# Patient Record
Sex: Female | Born: 1975 | Race: Black or African American | Hispanic: No | Marital: Married | Smoking: Never smoker
Health system: Southern US, Community
[De-identification: ages and names within clinical notes are randomized; demographics above are authoritative.]

## PROBLEM LIST (undated history)

## (undated) DIAGNOSIS — E785 Hyperlipidemia, unspecified: Secondary | ICD-10-CM

## (undated) DIAGNOSIS — K219 Gastro-esophageal reflux disease without esophagitis: Secondary | ICD-10-CM

## (undated) DIAGNOSIS — R7303 Prediabetes: Secondary | ICD-10-CM

## (undated) DIAGNOSIS — R0602 Shortness of breath: Secondary | ICD-10-CM

## (undated) DIAGNOSIS — G4730 Sleep apnea, unspecified: Secondary | ICD-10-CM

## (undated) DIAGNOSIS — D649 Anemia, unspecified: Secondary | ICD-10-CM

## (undated) DIAGNOSIS — F419 Anxiety disorder, unspecified: Secondary | ICD-10-CM

## (undated) DIAGNOSIS — R600 Localized edema: Secondary | ICD-10-CM

## (undated) HISTORY — DX: Localized edema: R60.0

## (undated) HISTORY — DX: Prediabetes: R73.03

## (undated) HISTORY — DX: Hyperlipidemia, unspecified: E78.5

## (undated) HISTORY — DX: Shortness of breath: R06.02

## (undated) HISTORY — DX: Sleep apnea, unspecified: G47.30

## (undated) HISTORY — DX: Anemia, unspecified: D64.9

## (undated) HISTORY — DX: Anxiety disorder, unspecified: F41.9

## (undated) HISTORY — DX: Gastro-esophageal reflux disease without esophagitis: K21.9

---

## 2017-09-12 ENCOUNTER — Encounter: Payer: Self-pay | Admitting: Family Medicine

## 2017-11-04 ENCOUNTER — Telehealth: Payer: Self-pay | Admitting: Family Medicine

## 2017-11-04 NOTE — Telephone Encounter (Signed)
Records received from [facility] sent back in folder

## 2017-11-17 ENCOUNTER — Encounter: Payer: Self-pay | Admitting: Family Medicine

## 2017-11-17 ENCOUNTER — Ambulatory Visit (INDEPENDENT_AMBULATORY_CARE_PROVIDER_SITE_OTHER): Payer: 59 | Admitting: Family Medicine

## 2017-11-17 ENCOUNTER — Other Ambulatory Visit (HOSPITAL_COMMUNITY)
Admission: RE | Admit: 2017-11-17 | Discharge: 2017-11-17 | Disposition: A | Discharge status: Home / Self Care | Payer: 59 | Source: Ambulatory Visit | Attending: Family Medicine | Admitting: Family Medicine

## 2017-11-17 VITALS — BP 120/74 | HR 80 | Ht 68.5 in | Wt 311.0 lb

## 2017-11-17 DIAGNOSIS — Z1231 Encounter for screening mammogram for malignant neoplasm of breast: Secondary | ICD-10-CM

## 2017-11-17 DIAGNOSIS — R7303 Prediabetes: Secondary | ICD-10-CM

## 2017-11-17 DIAGNOSIS — Z124 Encounter for screening for malignant neoplasm of cervix: Secondary | ICD-10-CM

## 2017-11-17 DIAGNOSIS — Z0000 Encounter for general adult medical examination without abnormal findings: Secondary | ICD-10-CM | POA: Insufficient documentation

## 2017-11-17 DIAGNOSIS — Z1239 Encounter for other screening for malignant neoplasm of breast: Secondary | ICD-10-CM

## 2017-11-17 LAB — POCT URINALYSIS DIP (PROADVANTAGE DEVICE)
BILIRUBIN UA: NEGATIVE
BILIRUBIN UA: NEGATIVE mg/dL
Glucose, UA: NEGATIVE mg/dL
LEUKOCYTES UA: NEGATIVE
NITRITE UA: NEGATIVE
Protein Ur, POC: NEGATIVE mg/dL
RBC UA: NEGATIVE
Specific Gravity, Urine: 1.025
Urobilinogen, Ur: NEGATIVE
pH, UA: 6 (ref 5.0–8.0)

## 2017-11-17 NOTE — Patient Instructions (Signed)
Start using the free app My Fitness Pal to track your daily calories and carbohydrates.  Cutting back even 50% on carbohydrates and sugars will make a difference.  Buy healthy snacks and clean out your kitchen of unhealthy ones.   Call and schedule your mammogram.   We will call you with your lab results.     Preventative Care for Adults - Female      MAINTAIN REGULAR HEALTH EXAMS:  A routine yearly physical is a good way to check in with your primary care provider about your health and preventive screening. It is also an opportunity to share updates about your health and any concerns you have, and receive a thorough all-over exam.   Most health insurance companies pay for at least some preventative services.  Check with your health plan for specific coverages.  WHAT PREVENTATIVE SERVICES DO WOMEN NEED?  Adult women should have their weight and blood pressure checked regularly.   Women age 40 and older should have their cholesterol levels checked regularly.  Women should be screened for cervical cancer with a Pap smear and pelvic exam beginning at either age 40, or 3 years after they become sexually activity.    Breast cancer screening generally begins at age 40 with a mammogram and breast exam by your primary care provider.    Beginning at age 40 and continuing to age 40, women should be screened for colorectal cancer.  Certain people may need continued testing until age 40.  Updating vaccinations is part of preventative care.  Vaccinations help protect against diseases such as the flu.  Osteoporosis is a disease in which the bones lose minerals and strength as we age. Women ages 8365 and over should discuss this with their caregivers, as should women after menopause who have other risk factors.  Lab tests are generally done as part of preventative care to screen for anemia and blood disorders, to screen for problems with the kidneys and liver, to screen for bladder problems, to  check blood sugar, and to check your cholesterol level.  Preventative services generally include counseling about diet, exercise, avoiding tobacco, drugs, excessive alcohol consumption, and sexually transmitted infections.    GENERAL RECOMMENDATIONS FOR GOOD HEALTH:  Healthy diet:  Eat a variety of foods, including fruit, vegetables, animal or vegetable protein, such as meat, fish, chicken, and eggs, or beans, lentils, tofu, and grains, such as rice.  Drink plenty of water daily.  Decrease saturated fat in the diet, avoid lots of red meat, processed foods, sweets, fast foods, and fried foods.  Exercise:  Aerobic exercise helps maintain good heart health. At least 30-40 minutes of moderate-intensity exercise is recommended. For example, a brisk walk that increases your heart rate and breathing. This should be done on most days of the week.   Find a type of exercise or a variety of exercises that you enjoy so that it becomes a part of your daily life.  Examples are running, walking, swimming, water aerobics, and biking.  For motivation and support, explore group exercise such as aerobic class, spin class, Zumba, Yoga,or  martial arts, etc.    Set exercise goals for yourself, such as a certain weight goal, walk or run in a race such as a 5k walk/run.  Speak to your primary care provider about exercise goals.  Disease prevention:  If you smoke or chew tobacco, find out from your caregiver how to quit. It can literally save your life, no matter how long you have been  a tobacco user. If you do not use tobacco, never begin.   Maintain a healthy diet and normal weight. Increased weight leads to problems with blood pressure and diabetes.   The Body Mass Index or BMI is a way of measuring how much of your body is fat. Having a BMI above 27 increases the risk of heart disease, diabetes, hypertension, stroke and other problems related to obesity. Your caregiver can help determine your BMI and based  on it develop an exercise and dietary program to help you achieve or maintain this important measurement at a healthful level.  High blood pressure causes heart and blood vessel problems.  Persistent high blood pressure should be treated with medicine if weight loss and exercise do not work.   Fat and cholesterol leaves deposits in your arteries that can block them. This causes heart disease and vessel disease elsewhere in your body.  If your cholesterol is found to be high, or if you have heart disease or certain other medical conditions, then you may need to have your cholesterol monitored frequently and be treated with medication.   Ask if you should have a cardiac stress test if your history suggests this. A stress test is a test done on a treadmill that looks for heart disease. This test can find disease prior to there being a problem.  Menopause can be associated with physical symptoms and risks. Hormone replacement therapy is available to decrease these. You should talk to your caregiver about whether starting or continuing to take hormones is right for you.   Osteoporosis is a disease in which the bones lose minerals and strength as we age. This can result in serious bone fractures. Risk of osteoporosis can be identified using a bone density scan. Women ages 6865 and over should discuss this with their caregivers, as should women after menopause who have other risk factors. Ask your caregiver whether you should be taking a calcium supplement and Vitamin D, to reduce the rate of osteoporosis.   Avoid drinking alcohol in excess (more than two drinks per day).  Avoid use of street drugs. Do not share needles with anyone. Ask for professional help if you need assistance or instructions on stopping the use of alcohol, cigarettes, and/or drugs.  Brush your teeth twice a day with fluoride toothpaste, and floss once a day. Good oral hygiene prevents tooth decay and gum disease. The problems can be  painful, unattractive, and can cause other health problems. Visit your dentist for a routine oral and dental check up and preventive care every 6-12 months.   Look at your skin regularly.  Use a mirror to look at your back. Notify your caregivers of changes in moles, especially if there are changes in shapes, colors, a size larger than a pencil eraser, an irregular border, or development of new moles.  Safety:  Use seatbelts 100% of the time, whether driving or as a passenger.  Use safety devices such as hearing protection if you work in environments with loud noise or significant background noise.  Use safety glasses when doing any work that could send debris in to the eyes.  Use a helmet if you ride a bike or motorcycle.  Use appropriate safety gear for contact sports.  Talk to your caregiver about gun safety.  Use sunscreen with a SPF (or skin protection factor) of 15 or greater.  Lighter skinned people are at a greater risk of skin cancer. Don't forget to also wear sunglasses in order to  protect your eyes from too much damaging sunlight. Damaging sunlight can accelerate cataract formation.   Practice safe sex. Use condoms. Condoms are used for birth control and to help reduce the spread of sexually transmitted infections (or STIs).  Some of the STIs are gonorrhea (the clap), chlamydia, syphilis, trichomonas, herpes, HPV (human papilloma virus) and HIV (human immunodeficiency virus) which causes AIDS. The herpes, HIV and HPV are viral illnesses that have no cure. These can result in disability, cancer and death.   Keep carbon monoxide and smoke detectors in your home functioning at all times. Change the batteries every 6 months or use a model that plugs into the wall.   Vaccinations:  Stay up to date with your tetanus shots and other required immunizations. You should have a booster for tetanus every 10 years. Be sure to get your flu shot every year, since 5%-20% of the [place] population comes down  with the flu. The flu vaccine changes each year, so being vaccinated once is not enough. Get your shot in the fall, before the flu season peaks.   Other vaccines to consider:  Human Papilloma Virus or HPV causes cancer of the cervix, and other infections that can be transmitted from person to person. There is a vaccine for HPV, and females should get immunized between the ages of 7911 and 7526. It requires a series of 3 shots.   Pneumococcal vaccine to protect against certain types of pneumonia.  This is normally recommended for adults age 40 or older.  However, adults younger than 40 years old with certain underlying conditions such as diabetes, heart or lung disease should also receive the vaccine.  Shingles vaccine to protect against Varicella Zoster if you are older than age 40, or younger than 40 years old with certain underlying illness.  Hepatitis A vaccine to protect against a form of infection of the liver by a virus acquired from food.  Hepatitis B vaccine to protect against a form of infection of the liver by a virus acquired from blood or body fluids, particularly if you work in health care.  If you plan to travel internationally, check with your local health department for specific vaccination recommendations.  Cancer Screening:  Breast cancer screening is essential to preventive care for women. All women age 40 and older should perform a breast self-exam every month. At age 40 and older, women should have their caregiver complete a breast exam each year. Women at ages 4540 and older should have a mammogram (x-ray film) of the breasts. Your caregiver can discuss how often you need mammograms.    Cervical cancer screening includes taking a Pap smear (sample of cells examined under a microscope) from the cervix (end of the uterus). It also includes testing for HPV (Human Papilloma Virus, which can cause cervical cancer). Screening and a pelvic exam should begin at age 421, or 3 years after a  woman becomes sexually active. Screening should occur every year, with a Pap smear but no HPV testing, up to age 130. After age 40, you should have a Pap smear every 3 years with HPV testing, if no HPV was found previously.   Most routine colon cancer screening begins at the age of 40. On a yearly basis, doctors may provide special easy to use take-home tests to check for hidden blood in the stool. Sigmoidoscopy or colonoscopy can detect the earliest forms of colon cancer and is life saving. These tests use a small camera at the end of a  tube to directly examine the colon. Speak to your caregiver about this at age 80, when routine screening begins (and is repeated every 5 years unless early forms of pre-cancerous polyps or small growths are found).

## 2017-11-17 NOTE — Progress Notes (Signed)
Subjective:    Patient ID: Lacey Gibson, female    DOB: March 26, 1976, 41 y.o.   MRN: [id number]  HPI Chief Complaint  Patient presents with  . Annual Exam    new patient fasting annual with pap. Would like to be screened for diabetes. No urinary symptoms.    She is new to the practice and here for a complete physical exam. Moved here from [place] in over a year ago.  Previous medical care: [facility]. Boneta LucksJennifer Brown, NP Last CPE: more than 1 year  States over the past couple of years she has had concerns about her mood.  States she gets irritable often.  Denies depression or anxiety.  States she thinks this could be hormone related around her cycles.  Denies ever taking medication for her mood.  She is concerned with her weight also. States she has had issues with her weight since around age 41 or [age]. Has lost 60 lbs in the past and gained it all back. Has tried weight loss supplements and phentermine in the past. Has not seen a nutritionist.  She was referred to a dietitian per medical records.  States she would like to be checked for diabetes. Reports having an elevated A1c in the past.  States she was told she was borderline and did not have diabetes.  Other providers: none   Past medical history: morbid obesity   Social history: Lives with husband and daughter, works for [employer]  Denies smoking, drinking alcohol, drug use  Diet: not sure how many calories she is eating. Unhealthy. Sweets and potato chips, high carbs.  Excerise: none   Immunizations: Tdap- 7 years ago. Flu shot at her work. November 2018.   Health maintenance:  Mammogram: never Colonoscopy: N/A  Last Pap Smear: over a year ago.  Last Dental Exam: over a year  Last Eye Exam: years ago   Wears seatbelt always,  smoke detectors in home and functioning, does not text while driving and feels safe in home environment.   Reviewed allergies, medications, past medical, surgical, family, and social  history.   Review of Systems Review of Systems Constitutional: -fever, -chills, -sweats, -unexpected weight change,-fatigue ENT: -runny nose, -ear pain, -sore throat Cardiology:  -chest pain, -palpitations, -edema Respiratory: -cough, -shortness of breath, -wheezing Gastroenterology: -abdominal pain, -nausea, -vomiting, -diarrhea, -constipation  Hematology: -bleeding or bruising problems Musculoskeletal: -arthralgias, -myalgias, -joint swelling, -back pain Ophthalmology: -vision changes Urology: -dysuria, -difficulty urinating, -hematuria, -urinary frequency, -urgency Neurology: -headache, -weakness, -tingling, -numbness       Objective:   Physical Exam BP 120/74   Pulse 80   Ht 5' 8.5" (1.74 m)   Wt (!) 311 lb (141.1 kg)   LMP 10/26/2017 (Approximate)   BMI 46.60 kg/m   General Appearance:    Alert, cooperative, no distress, appears stated age  Head:    Normocephalic, without obvious abnormality, atraumatic  Eyes:    PERRL, conjunctiva/corneas clear, EOM's intact, fundi    benign  Ears:    Normal TM's and external ear canals  Nose:   Nares normal, mucosa normal, no drainage or sinus   tenderness  Throat:   Lips, mucosa, and tongue normal; teeth and gums normal  Neck:   Supple, no lymphadenopathy;  thyroid:  no   enlargement/tenderness/nodules; no carotid   bruit or JVD  Back:    Spine nontender, no curvature, ROM normal, no CVA     tenderness  Lungs:     Clear to auscultation bilaterally without wheezes,  rales or     ronchi; respirations unlabored  Chest Wall:    No tenderness or deformity   Heart:    Regular rate and rhythm, S1 and S2 normal, no murmur, rub   or gallop  Breast Exam:    No tenderness, masses, or nipple discharge or inversion.      No axillary lymphadenopathy  Abdomen:     Soft, non-tender, nondistended, normoactive bowel sounds,    no masses, no hepatosplenomegaly  Genitalia:    Normal external genitalia without lesions.  BUS and vagina normal; cervix  without lesions however unable to visualize entire cervix due to body habitus, no cervical motion tenderness. No abnormal vaginal discharge.  Uterus and adnexa not enlarged, nontender, no masses.  Pap performed.  Rectal:    Not performed due to age<40 and no related complaints  Extremities:   No clubbing, cyanosis or edema  Pulses:   2+ and symmetric all extremities  Skin:   Skin color, texture, turgor normal, no rashes or lesions  Lymph nodes:   Cervical, supraclavicular, and axillary nodes normal  Neurologic:   CNII-XII intact, normal strength, sensation and gait; reflexes 2+ and symmetric throughout          Psych:   Normal mood, affect, hygiene and grooming.     Urinalysis dipstick: Negative      Assessment & Plan:  Routine general medical examination at a health care facility - Plan: POCT Urinalysis DIP (Proadvantage Device), CBC with Differential/Platelet, Comprehensive metabolic panel, TSH, Lipid panel  Screening for cervical cancer - Plan: Cytology - PAP  Prediabetes - Plan: Comprehensive metabolic panel, TSH, Lipid panel, Hemoglobin A1c  Screening for breast cancer - Plan: MM DIGITAL SCREENING BILATERAL  Morbid obesity (HCC) - Plan: CBC with Differential/Platelet, Comprehensive metabolic panel, TSH, Lipid panel  She appears to be doing well overall.  She is concerned about her weight and this has been a issue for her for several years.  She has tried multiple diet medications including phentermine in the past with weight loss but gained it all back.  In depth counseling regarding weight loss including cutting back on calories, carbohydrates and using a free app such as my fitness pal to track daily caloric intake.  Discussed the possibility of seeing a nutritionist or even referral to weight loss clinic.  We will check labs and then discuss this again. Mammogram ordered.  She will call and schedule this. History of prediabetes.  She was A1c in the past 5.8%.  We will recheck  hemoglobin A1c today.  Pap smear done and chaperone present. Unable to completely visualize cervical os due to body habitus and discussed this with patient.  If this comes back as insufficient sample then I will most likely refer to OB/GYN. Follow-up pending labs.

## 2017-11-18 LAB — CBC WITH DIFFERENTIAL/PLATELET
BASOS PCT: 1 %
Basophils Absolute: 67 cells/uL (ref 0–200)
EOS ABS: 221 {cells}/uL (ref 15–500)
Eosinophils Relative: 3.3 %
HCT: 38.1 % (ref 35.0–45.0)
HEMOGLOBIN: 11.9 g/dL (ref 11.7–15.5)
Lymphs Abs: 2305 cells/uL (ref 850–3900)
MCH: 23.4 pg — AB (ref 27.0–33.0)
MCHC: 31.2 g/dL — ABNORMAL LOW (ref 32.0–36.0)
MCV: 74.9 fL — AB (ref 80.0–100.0)
MPV: 10 fL (ref 7.5–12.5)
Monocytes Relative: 7.1 %
NEUTROS ABS: 3631 {cells}/uL (ref 1500–7800)
Neutrophils Relative %: 54.2 %
Platelets: 338 10*3/uL (ref 140–400)
RBC: 5.09 10*6/uL (ref 3.80–5.10)
RDW: 14.5 % (ref 11.0–15.0)
Total Lymphocyte: 34.4 %
WBC mixed population: 476 cells/uL (ref 200–950)
WBC: 6.7 10*3/uL (ref 3.8–10.8)

## 2017-11-18 LAB — COMPREHENSIVE METABOLIC PANEL
AG RATIO: 1.2 (calc) (ref 1.0–2.5)
ALKALINE PHOSPHATASE (APISO): 57 U/L (ref 33–115)
ALT: 21 U/L (ref 6–29)
AST: 17 U/L (ref 10–30)
Albumin: 3.7 g/dL (ref 3.6–5.1)
BUN: 9 mg/dL (ref 7–25)
CHLORIDE: 107 mmol/L (ref 98–110)
CO2: 19 mmol/L — AB (ref 20–32)
Calcium: 8.8 mg/dL (ref 8.6–10.2)
Creat: 0.83 mg/dL (ref 0.50–1.10)
GLOBULIN: 3.1 g/dL (ref 1.9–3.7)
Glucose, Bld: 94 mg/dL (ref 65–99)
Potassium: 4.4 mmol/L (ref 3.5–5.3)
Sodium: 136 mmol/L (ref 135–146)
Total Bilirubin: 0.3 mg/dL (ref 0.2–1.2)
Total Protein: 6.8 g/dL (ref 6.1–8.1)

## 2017-11-18 LAB — LIPID PANEL
CHOL/HDL RATIO: 3 (calc) (ref <5.0)
CHOLESTEROL: 218 mg/dL — AB (ref <200)
HDL: 72 mg/dL (ref >50)
LDL Cholesterol (Calc): 130 mg/dL (calc) — ABNORMAL HIGH
Non-HDL Cholesterol (Calc): 146 mg/dL (calc) — ABNORMAL HIGH (ref <130)
Triglycerides: 69 mg/dL (ref <150)

## 2017-11-18 LAB — TSH: TSH: 2.11 m[IU]/L

## 2017-11-18 LAB — CYTOLOGY - PAP
Diagnosis: NEGATIVE
HPV: NOT DETECTED

## 2017-11-18 LAB — HEMOGLOBIN A1C
EAG (MMOL/L): 6.5 (calc)
Hgb A1c MFr Bld: 5.7 % of total Hgb — ABNORMAL HIGH (ref <5.7)
MEAN PLASMA GLUCOSE: 117 (calc)

## 2017-11-23 ENCOUNTER — Encounter: Payer: Self-pay | Admitting: Family Medicine

## 2017-11-23 DIAGNOSIS — R7303 Prediabetes: Secondary | ICD-10-CM

## 2017-11-23 DIAGNOSIS — E785 Hyperlipidemia, unspecified: Secondary | ICD-10-CM

## 2017-11-23 HISTORY — DX: Prediabetes: R73.03

## 2017-11-23 HISTORY — DX: Hyperlipidemia, unspecified: E78.5

## 2017-11-24 ENCOUNTER — Telehealth: Payer: Self-pay

## 2017-11-24 NOTE — Telephone Encounter (Signed)
Called patient and gave lab results. Patient had no questions or concerns.  

## 2017-11-24 NOTE — Telephone Encounter (Signed)
-----   Message from Avanell ShackletonVickie L Henson, NP-C sent at 11/23/2017  6:15 PM EST ----- Her hemoglobin A1c is 5.7% and shows that she continues to have prediabetes.  Her LDL, bad cholesterol, is elevated.  I recommend that she cut back on fried foods, fatty foods, creamy sauces and dressings and increase her physical activity.  Fortunately her good cholesterol or HDL is in a good range.  Her other blood work is fine.  Her Pap smear is negative for any abnormal cells or the HPV virus.  She did have some yeast present on the Pap smear but I do not recall her having any symptoms.  If she is not symptomatic then this does not require treatment.

## 2017-12-29 ENCOUNTER — Ambulatory Visit
Admission: RE | Admit: 2017-12-29 | Discharge: 2017-12-29 | Disposition: A | Discharge status: Home / Self Care | Payer: 59 | Source: Ambulatory Visit | Attending: Family Medicine | Admitting: Family Medicine

## 2017-12-29 DIAGNOSIS — Z1239 Encounter for other screening for malignant neoplasm of breast: Secondary | ICD-10-CM

## 2018-01-13 ENCOUNTER — Telehealth: Payer: Self-pay

## 2018-01-13 NOTE — Telephone Encounter (Signed)
Pt called the office and did not say what she needed . Called pt back and no answer . Thanks [employer]-Pal[employer]iveKH

## 2018-09-13 ENCOUNTER — Other Ambulatory Visit (INDEPENDENT_AMBULATORY_CARE_PROVIDER_SITE_OTHER): Payer: 59

## 2018-09-13 DIAGNOSIS — Z23 Encounter for immunization: Secondary | ICD-10-CM

## 2018-12-04 ENCOUNTER — Telehealth: Payer: Self-pay

## 2018-12-04 NOTE — Telephone Encounter (Signed)
She can schedule an appointment.

## 2018-12-04 NOTE — Telephone Encounter (Signed)
Patient called and stated she has been experiencing 2 period for the past 2 months. She is not on birth control. She does not see an OBGYN. Would you like this patient to schedule an appt to be seen?

## 2018-12-04 NOTE — Telephone Encounter (Signed)
Patient has been scheduled

## 2018-12-18 ENCOUNTER — Encounter: Payer: Self-pay | Admitting: Family Medicine

## 2018-12-18 ENCOUNTER — Ambulatory Visit (INDEPENDENT_AMBULATORY_CARE_PROVIDER_SITE_OTHER): Payer: 59 | Admitting: Family Medicine

## 2018-12-18 VITALS — BP 140/80 | HR 78 | Wt 328.8 lb

## 2018-12-18 DIAGNOSIS — L659 Nonscarring hair loss, unspecified: Secondary | ICD-10-CM

## 2018-12-18 DIAGNOSIS — N920 Excessive and frequent menstruation with regular cycle: Secondary | ICD-10-CM | POA: Diagnosis not present

## 2018-12-18 DIAGNOSIS — R7303 Prediabetes: Secondary | ICD-10-CM | POA: Diagnosis not present

## 2018-12-18 HISTORY — DX: Morbid (severe) obesity due to excess calories: E66.01

## 2018-12-18 LAB — POCT URINE PREGNANCY: Preg Test, Ur: NEGATIVE

## 2018-12-18 NOTE — Progress Notes (Signed)
   Subjective:    Patient ID: Lacey Gibson, female    DOB: [birth year], 43 y.o.   MRN: [id number]  HPI Chief Complaint  Patient presents with  . periods    abnormal periods- started back in december 2019. 2 seven day periods a month.  november twice, december once and january 4th.    She is here with complaints of having 2 periods in November. Both lasting 7 days. Then December and January she had her usual cycles.  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  Requests to have her "hormones" checked. Reports having 1-2 night sweats per month.  Reports having a history of irregular menses.  States she took birth control pills but stopped when she turned 31 to have her child.  She is also noticed increased hair loss. No hirsutism.  Denies any intolerance to heat or cold.  No changes in bowel habits.   Weight gain of 17 lbs since 11/17/17  Denies fever,  dizziness, chest pain, palpitations, shortness of breath, abdominal pain, N/V/D, urinary symptoms, LE edema.    LMP: 12/02/2018 No contraception.  Last pap smear: 10/2017 and normal.  Negative HPV.  Does not currently have a gynecologist.  Review of Systems Pertinent positives and negatives in the history of present illness.     Objective:   Physical Exam BP 140/80   Pulse 78   Wt (!) 328 lb 12.8 oz (149.1 kg)   LMP 12/02/2018   BMI 49.27 kg/m   Alert and oriented and in no distress. Pharyngeal area is normal. Neck is supple without adenopathy or thyromegaly. Cardiac exam shows a regular rhythm without murmurs or gallops. Lungs are clear to auscultation. Abdomen is soft, nondistended, nontender, normal bowel sounds, no palpable masses.  Extremities without edema, pulses intact.  Skin is warm and dry, no pallor or rash. PERRLA, EOMs intact. CNs intact. DTRs symmetric and normal, no clonus.       Assessment & Plan:  Unusually frequent menses - Plan: CBC with Differential/Platelet,  Comprehensive metabolic panel, POCT urine pregnancy, TSH, T4, free, T3, Follicle Stimulating Hormone, Estradiol  Hair loss - Plan: CBC with Differential/Platelet, Comprehensive metabolic panel, TSH, T4, free, T3  Morbid obesity (HCC) - Plan: TSH, T4, free, T3, Hemoglobin A1c  Prediabetes - Plan: Comprehensive metabolic panel, TSH, T4, free, T3, Hemoglobin A1c  Urine pregnancy- negative  Discussed PCOS since this is her main concern.  Discussed that she is having regular periods and has had one abnormal cycle between in November.  She would like to find out why this is happening.  She thinks she may have PCOS. Check labs and follow-up.  She may call and schedule with OB/GYN if she would like.

## 2018-12-18 NOTE — Patient Instructions (Signed)
Obgyn Offices:   [facility] [address] [place],  [address] [phone]  [facility] Address: [address] [place], [place] [address] Phone: [phone]  [facility] [address] [place], [place] [address] Phone: [phone]   [facility] [address] [place], [place] [address] Phone: [phone] 

## 2018-12-19 LAB — CBC WITH DIFFERENTIAL/PLATELET
BASOS: 1 %
Basophils Absolute: 0.1 10*3/uL (ref 0.0–0.2)
EOS (ABSOLUTE): 0.1 10*3/uL (ref 0.0–0.4)
Eos: 2 %
HEMOGLOBIN: 11.2 g/dL (ref 11.1–15.9)
Hematocrit: 34.9 % (ref 34.0–46.6)
IMMATURE GRANS (ABS): 0 10*3/uL (ref 0.0–0.1)
Immature Granulocytes: 0 %
LYMPHS: 42 %
Lymphocytes Absolute: 3.2 10*3/uL — ABNORMAL HIGH (ref 0.7–3.1)
MCH: 22.5 pg — AB (ref 26.6–33.0)
MCHC: 32.1 g/dL (ref 31.5–35.7)
MCV: 70 fL — ABNORMAL LOW (ref 79–97)
MONOCYTES: 7 %
Monocytes Absolute: 0.5 10*3/uL (ref 0.1–0.9)
NEUTROS ABS: 3.8 10*3/uL (ref 1.4–7.0)
Neutrophils: 48 %
Platelets: 346 10*3/uL (ref 150–450)
RBC: 4.97 x10E6/uL (ref 3.77–5.28)
RDW: 16.4 % — ABNORMAL HIGH (ref 11.7–15.4)
WBC: 7.7 10*3/uL (ref 3.4–10.8)

## 2018-12-19 LAB — HEMOGLOBIN A1C
Est. average glucose Bld gHb Est-mCnc: 117 mg/dL
Hgb A1c MFr Bld: 5.7 % — ABNORMAL HIGH (ref 4.8–5.6)

## 2018-12-19 LAB — COMPREHENSIVE METABOLIC PANEL
A/G RATIO: 1.3 (ref 1.2–2.2)
ALBUMIN: 3.9 g/dL (ref 3.8–4.8)
ALT: 22 IU/L (ref 0–32)
AST: 17 IU/L (ref 0–40)
Alkaline Phosphatase: 64 IU/L (ref 39–117)
BILIRUBIN TOTAL: 0.3 mg/dL (ref 0.0–1.2)
BUN / CREAT RATIO: 8 — AB (ref 9–23)
BUN: 8 mg/dL (ref 6–24)
CHLORIDE: 105 mmol/L (ref 96–106)
CO2: 22 mmol/L (ref 20–29)
Calcium: 9.4 mg/dL (ref 8.7–10.2)
Creatinine, Ser: 0.97 mg/dL (ref 0.57–1.00)
GFR calc non Af Amer: 73 mL/min/{1.73_m2} (ref >59)
GFR, EST AFRICAN AMERICAN: 84 mL/min/{1.73_m2} (ref >59)
GLOBULIN, TOTAL: 3 g/dL (ref 1.5–4.5)
Glucose: 93 mg/dL (ref 65–99)
POTASSIUM: 4.4 mmol/L (ref 3.5–5.2)
Sodium: 140 mmol/L (ref 134–144)
TOTAL PROTEIN: 6.9 g/dL (ref 6.0–8.5)

## 2018-12-19 LAB — T4, FREE: Free T4: 1 ng/dL (ref 0.82–1.77)

## 2018-12-19 LAB — ESTRADIOL: ESTRADIOL: 105.2 pg/mL

## 2018-12-19 LAB — TSH: TSH: 1.91 u[IU]/mL (ref 0.450–4.500)

## 2018-12-19 LAB — FOLLICLE STIMULATING HORMONE: FSH: 4.4 m[IU]/mL

## 2018-12-19 LAB — T3: T3 TOTAL: 108 ng/dL (ref 71–180)

## 2019-02-05 ENCOUNTER — Other Ambulatory Visit: Payer: Self-pay | Admitting: Family Medicine

## 2019-02-05 DIAGNOSIS — Z1231 Encounter for screening mammogram for malignant neoplasm of breast: Secondary | ICD-10-CM

## 2019-03-09 ENCOUNTER — Ambulatory Visit: Payer: 59

## 2019-04-24 ENCOUNTER — Ambulatory Visit: Payer: 59

## 2019-05-05 ENCOUNTER — Other Ambulatory Visit: Payer: Self-pay

## 2019-05-05 ENCOUNTER — Ambulatory Visit
Admission: RE | Admit: 2019-05-05 | Discharge: 2019-05-05 | Disposition: A | Discharge status: Home / Self Care | Payer: 59 | Source: Ambulatory Visit | Attending: Family Medicine | Admitting: Family Medicine

## 2019-05-05 DIAGNOSIS — Z1231 Encounter for screening mammogram for malignant neoplasm of breast: Secondary | ICD-10-CM

## 2019-08-30 ENCOUNTER — Other Ambulatory Visit: Payer: Self-pay

## 2019-08-30 ENCOUNTER — Other Ambulatory Visit (INDEPENDENT_AMBULATORY_CARE_PROVIDER_SITE_OTHER): Payer: 59

## 2019-08-30 DIAGNOSIS — Z23 Encounter for immunization: Secondary | ICD-10-CM | POA: Diagnosis not present

## 2019-10-15 ENCOUNTER — Other Ambulatory Visit: Payer: Self-pay

## 2019-10-15 ENCOUNTER — Other Ambulatory Visit (HOSPITAL_COMMUNITY)
Admission: RE | Admit: 2019-10-15 | Discharge: 2019-10-15 | Disposition: A | Discharge status: Home / Self Care | Payer: 59 | Source: Ambulatory Visit | Attending: Obstetrics and Gynecology | Admitting: Obstetrics and Gynecology

## 2019-10-15 ENCOUNTER — Encounter: Payer: Self-pay | Admitting: Obstetrics and Gynecology

## 2019-10-15 ENCOUNTER — Ambulatory Visit (INDEPENDENT_AMBULATORY_CARE_PROVIDER_SITE_OTHER): Payer: 59 | Admitting: Obstetrics and Gynecology

## 2019-10-15 VITALS — BP 130/81 | HR 82 | Ht 69.0 in | Wt 321.8 lb

## 2019-10-15 DIAGNOSIS — Z1151 Encounter for screening for human papillomavirus (HPV): Secondary | ICD-10-CM | POA: Diagnosis not present

## 2019-10-15 DIAGNOSIS — N760 Acute vaginitis: Secondary | ICD-10-CM

## 2019-10-15 DIAGNOSIS — N939 Abnormal uterine and vaginal bleeding, unspecified: Secondary | ICD-10-CM | POA: Insufficient documentation

## 2019-10-15 DIAGNOSIS — N898 Other specified noninflammatory disorders of vagina: Secondary | ICD-10-CM

## 2019-10-15 DIAGNOSIS — Z124 Encounter for screening for malignant neoplasm of cervix: Secondary | ICD-10-CM | POA: Diagnosis not present

## 2019-10-15 DIAGNOSIS — Z3202 Encounter for pregnancy test, result negative: Secondary | ICD-10-CM

## 2019-10-15 DIAGNOSIS — Z01419 Encounter for gynecological examination (general) (routine) without abnormal findings: Secondary | ICD-10-CM

## 2019-10-15 LAB — POCT URINE PREGNANCY: Preg Test, Ur: NEGATIVE

## 2019-10-15 NOTE — Addendum Note (Signed)
Addended by: Tristan Schroeder D on: 10/15/2019 03:12 PM   Modules accepted: Orders

## 2019-10-15 NOTE — Progress Notes (Signed)
Subjective:     Lacey Gibson is a 43 y.o. female P1 with BMI 47 who is here for a comprehensive physical exam. The patient reports issues with her menstrual cycles. She reports experiencing irregularly two 7-day periods per month. This change in her cycle has been present over the past year. She is sexually active using nothing for contraception. She is not seeking a pregnancy. She reports vaginal pruritis which she treated with over the counter medication a few days ago  Past Medical History:  Diagnosis Date  . Hyperlipidemia 11/23/2017  . Prediabetes 11/23/2017   No past surgical history on file. Family History  Problem Relation Age of Onset  . Diabetes Mother   . Hypertension Mother   . Alzheimer's disease Father 24  . Diabetes Maternal Grandmother    Social History   Socioeconomic History  . Marital status: Married    Spouse name: Not on file  . Number of children: Not on file  . Years of education: Not on file  . Highest education level: Not on file  Occupational History  . Not on file  Social Needs  . Financial resource strain: Not on file  . Food insecurity    Worry: Not on file    Inability: Not on file  . Transportation needs    Medical: Not on file    Non-medical: Not on file  Tobacco Use  . Smoking status: Never Smoker  . Smokeless tobacco: Never Used  Substance and Sexual Activity  . Alcohol use: Yes    Comment: 0-1  . Drug use: No  . Sexual activity: Yes    Birth control/protection: None  Lifestyle  . Physical activity    Days per week: Not on file    Minutes per session: Not on file  . Stress: Not on file  Relationships  . Social Herbalist on phone: Not on file    Gets together: Not on file    Attends religious service: Not on file    Active member of club or organization: Not on file    Attends meetings of clubs or organizations: Not on file    Relationship status: Not on file  . Intimate partner violence    Fear of current or  ex partner: Not on file    Emotionally abused: Not on file    Physically abused: Not on file    Forced sexual activity: Not on file  Other Topics Concern  . Not on file  Social History Narrative  . Not on file   Health Maintenance  Topic Date Due  . HIV Screening  08/23/1992  . TETANUS/TDAP  08/23/1996  . PAP SMEAR-Modifier  11/17/2020  . INFLUENZA VACCINE  Completed       Review of Systems Pertinent items are noted in HPI.   Objective:  Blood pressure 130/81, pulse 82, height 5\' 9"  (1.753 m), weight (!) 321 lb 12.8 oz (146 kg), last menstrual period 10/11/2019.     GENERAL: Well-developed, well-nourished female in no acute distress.  HEENT: Normocephalic, atraumatic. Sclerae anicteric.  NECK: Supple. Normal thyroid.  LUNGS: Clear to auscultation bilaterally.  HEART: Regular rate and rhythm. BREASTS: Symmetric in size. No palpable masses or lymphadenopathy, skin changes, or nipple drainage. ABDOMEN: Soft, nontender, nondistended. No organomegaly. PELVIC: Normal external female genitalia. Vagina is pink and rugated.  Normal discharge. Normal appearing cervix. Bimanual exam limited secondary to body habitus. EXTREMITIES: No cyanosis, clubbing, or edema, 2+ distal pulses.    Assessment:  Healthy female exam.      Plan:    Pap smear collected Patient with normal screening mammogram 04/2019 Pelvic ultrasound ordered Discussed benefits of endometrial biopsy ENDOMETRIAL BIOPSY     The indications for endometrial biopsy were reviewed.   Risks of the biopsy including cramping, bleeding, infection, uterine perforation, inadequate specimen and need for additional procedures  were discussed. The patient states she understands and agrees to undergo procedure today. Consent was signed. Time out was performed. Urine HCG was negative. A sterile speculum was placed in the patient's vagina and the cervix was prepped with Betadine. A single-toothed tenaculum was placed on the anterior  lip of the cervix to stabilize it. The uterine cavity was sounded to a depth of 12 cm using the uterine sound. The 3 mm pipelle was introduced into the endometrial cavity without difficulty, 2 passes were made.  A  moderate amount of tissue was  sent to pathology. The instruments were removed from the patient's vagina. Minimal bleeding from the cervix was noted. The patient tolerated the procedure well.  Routine post-procedure instructions were given to the patient. The patient will follow up in two weeks to review the results and for further management.   See After Visit Summary for Counseling Recommendations

## 2019-10-15 NOTE — Progress Notes (Signed)
New Patient is in the office for annual, LMP 10-11-19. Pt states cycle is every 25 days or a little sooner.  Pt reports vaginal itching, requests testing for yeast infection. Last pap 11-17-17 Last Mammogram 05-05-19 GAD7= 3

## 2019-10-15 NOTE — Patient Instructions (Signed)
Contraception Choices Contraception, also called birth control, refers to methods or devices that prevent pregnancy. Hormonal methods Contraceptive implant  A contraceptive implant is a thin, plastic tube that contains a hormone. It is inserted into the upper part of the arm. It can remain in place for up to 3 years. Progestin-only injections Progestin-only injections are injections of progestin, a synthetic form of the hormone progesterone. They are given every 3 months by a health care provider. Birth control pills  Birth control pills are pills that contain hormones that prevent pregnancy. They must be taken once a day, preferably at the same time each day. Birth control patch  The birth control patch contains hormones that prevent pregnancy. It is placed on the skin and must be changed once a week for three weeks and removed on the fourth week. A prescription is needed to use this method of contraception. Vaginal ring  A vaginal ring contains hormones that prevent pregnancy. It is placed in the vagina for three weeks and removed on the fourth week. After that, the process is repeated with a new ring. A prescription is needed to use this method of contraception. Emergency contraceptive Emergency contraceptives prevent pregnancy after unprotected sex. They come in pill form and can be taken up to 5 days after sex. They work best the sooner they are taken after having sex. Most emergency contraceptives are available without a prescription. This method should not be used as your only form of birth control. Barrier methods Female condom  A female condom is a thin sheath that is worn over the penis during sex. Condoms keep sperm from going inside a woman's body. They can be used with a spermicide to increase their effectiveness. They should be disposed after a single use. Female condom  A female condom is a soft, loose-fitting sheath that is put into the vagina before sex. The condom keeps sperm  from going inside a woman's body. They should be disposed after a single use. Diaphragm  A diaphragm is a soft, dome-shaped barrier. It is inserted into the vagina before sex, along with a spermicide. The diaphragm blocks sperm from entering the uterus, and the spermicide kills sperm. A diaphragm should be left in the vagina for 6-8 hours after sex and removed within 24 hours. A diaphragm is prescribed and fitted by a health care provider. A diaphragm should be replaced every 1-2 years, after giving birth, after gaining more than 15 lb (6.8 kg), and after pelvic surgery. Cervical cap  A cervical cap is a round, soft latex or plastic cup that fits over the cervix. It is inserted into the vagina before sex, along with spermicide. It blocks sperm from entering the uterus. The cap should be left in place for 6-8 hours after sex and removed within 48 hours. A cervical cap must be prescribed and fitted by a health care provider. It should be replaced every 2 years. Sponge  A sponge is a soft, circular piece of polyurethane foam with spermicide on it. The sponge helps block sperm from entering the uterus, and the spermicide kills sperm. To use it, you make it wet and then insert it into the vagina. It should be inserted before sex, left in for at least 6 hours after sex, and removed and thrown away within 30 hours. Spermicides Spermicides are chemicals that kill or block sperm from entering the cervix and uterus. They can come as a cream, jelly, suppository, foam, or tablet. A spermicide should be inserted into the   vagina with an applicator at least 10-15 minutes before sex to allow time for it to work. The process must be repeated every time you have sex. Spermicides do not require a prescription. Intrauterine contraception Intrauterine device (IUD) An IUD is a T-shaped device that is put in a woman's uterus. There are two types:  Hormone IUD.This type contains progestin, a synthetic form of the hormone  progesterone. This type can stay in place for 3-5 years.  Copper IUD.This type is wrapped in copper wire. It can stay in place for 10 years.  Permanent methods of contraception Female tubal ligation In this method, a woman's fallopian tubes are sealed, tied, or blocked during surgery to prevent eggs from traveling to the uterus. Hysteroscopic sterilization In this method, a small, flexible insert is placed into each fallopian tube. The inserts cause scar tissue to form in the fallopian tubes and block them, so sperm cannot reach an egg. The procedure takes about 3 months to be effective. Another form of birth control must be used during those 3 months. Female sterilization This is a procedure to tie off the tubes that carry sperm (vasectomy). After the procedure, the man can still ejaculate fluid (semen). Natural planning methods Natural family planning In this method, a couple does not have sex on days when the woman could become pregnant. Calendar method This means keeping track of the length of each menstrual cycle, identifying the days when pregnancy can happen, and not having sex on those days. Ovulation method In this method, a couple avoids sex during ovulation. Symptothermal method This method involves not having sex during ovulation. The woman typically checks for ovulation by watching changes in her temperature and in the consistency of cervical mucus. Post-ovulation method In this method, a couple waits to have sex until after ovulation. Summary  Contraception, also called birth control, means methods or devices that prevent pregnancy.  Hormonal methods of contraception include implants, injections, pills, patches, vaginal rings, and emergency contraceptives.  Barrier methods of contraception can include female condoms, female condoms, diaphragms, cervical caps, sponges, and spermicides.  There are two types of IUDs (intrauterine devices). An IUD can be put in a woman's uterus to  prevent pregnancy for 3-5 years.  Permanent sterilization can be done through a procedure for males, females, or both.  Natural family planning methods involve not having sex on days when the woman could become pregnant. This information is not intended to replace advice given to you by your health care provider. Make sure you discuss any questions you have with your health care provider. Document Released: 11/15/2005 Document Revised: 11/17/2017 Document Reviewed: 12/18/2016 Elsevier Patient Education  2020 Elsevier Inc.  

## 2019-10-16 LAB — CERVICOVAGINAL ANCILLARY ONLY
Bacterial Vaginitis (gardnerella): NEGATIVE
Candida Glabrata: NEGATIVE
Candida Vaginitis: NEGATIVE
Comment: NEGATIVE
Comment: NEGATIVE
Comment: NEGATIVE

## 2019-10-17 LAB — CYTOLOGY - PAP
Comment: NEGATIVE
Diagnosis: NEGATIVE
High risk HPV: NEGATIVE

## 2019-10-17 LAB — SURGICAL PATHOLOGY

## 2019-10-24 ENCOUNTER — Other Ambulatory Visit: Payer: 59

## 2019-12-18 ENCOUNTER — Other Ambulatory Visit: Payer: 59

## 2020-03-08 ENCOUNTER — Ambulatory Visit: Payer: 59 | Attending: Internal Medicine

## 2020-03-08 DIAGNOSIS — Z23 Encounter for immunization: Secondary | ICD-10-CM

## 2020-03-08 NOTE — Progress Notes (Signed)
   Covid-19 Vaccination Clinic  Name:  Lacey Gibson    MRN: [id number] DOB: 06/12/1976  03/08/2020  [name] was observed post Covid-19 immunization for 15 minutes without incident. She was provided with Vaccine Information Sheet and instruction to access the V-Safe system.   [name] was instructed to call 911 with any severe reactions post vaccine: Marland Kitchen Difficulty breathing  . Swelling of face and throat  . A fast heartbeat  . A bad rash all over body  . Dizziness and weakness   Immunizations Administered    Name Date Dose VIS Date Route   Pfizer COVID-19 Vaccine 03/08/2020  2:40 PM 0.3 mL 11/09/2019 Intramuscular   Manufacturer: [employer], [employer]   Lot: [id number]   NDC: [address]-4

## 2020-04-01 ENCOUNTER — Ambulatory Visit: Payer: 59 | Attending: Internal Medicine

## 2020-04-01 DIAGNOSIS — Z23 Encounter for immunization: Secondary | ICD-10-CM

## 2020-04-01 NOTE — Progress Notes (Signed)
   Covid-19 Vaccination Clinic  Name:  Lacey Gibson    MRN: [id number] DOB: [birth year]  04/01/2020  Lacey Gibson was observed post Covid-19 immunization for 15 minutes without incident. She was provided with Vaccine Information Sheet and instruction to access the V-Safe system.   Lacey Gibson was instructed to call 911 with any severe reactions post vaccine: Marland Kitchen Difficulty breathing  . Swelling of face and throat  . A fast heartbeat  . A bad rash all over body  . Dizziness and weakness   Immunizations Administered    Name Date Dose VIS Date Route   Pfizer COVID-19 Vaccine 04/01/2020  3:40 PM 0.3 mL 01/23/2019 Intramuscular   Manufacturer: [employer], [employer]   Lot: [id number]   NDC: [address]-2

## 2020-06-10 ENCOUNTER — Other Ambulatory Visit: Payer: Self-pay | Admitting: Family Medicine

## 2020-06-10 DIAGNOSIS — Z1231 Encounter for screening mammogram for malignant neoplasm of breast: Secondary | ICD-10-CM

## 2020-06-18 ENCOUNTER — Other Ambulatory Visit: Payer: Self-pay

## 2020-06-18 ENCOUNTER — Ambulatory Visit
Admission: RE | Admit: 2020-06-18 | Discharge: 2020-06-18 | Disposition: A | Discharge status: Home / Self Care | Payer: 59 | Source: Ambulatory Visit | Attending: Family Medicine | Admitting: Family Medicine

## 2020-06-18 DIAGNOSIS — Z1231 Encounter for screening mammogram for malignant neoplasm of breast: Secondary | ICD-10-CM

## 2020-07-16 ENCOUNTER — Other Ambulatory Visit: Payer: Self-pay

## 2020-07-16 ENCOUNTER — Encounter: Payer: Self-pay | Admitting: Family Medicine

## 2020-07-16 ENCOUNTER — Ambulatory Visit (INDEPENDENT_AMBULATORY_CARE_PROVIDER_SITE_OTHER): Payer: 59 | Admitting: Family Medicine

## 2020-07-16 VITALS — BP 124/80 | HR 75 | Ht 68.25 in | Wt 329.0 lb

## 2020-07-16 DIAGNOSIS — Z0000 Encounter for general adult medical examination without abnormal findings: Secondary | ICD-10-CM | POA: Diagnosis not present

## 2020-07-16 DIAGNOSIS — Z23 Encounter for immunization: Secondary | ICD-10-CM

## 2020-07-16 DIAGNOSIS — E785 Hyperlipidemia, unspecified: Secondary | ICD-10-CM

## 2020-07-16 DIAGNOSIS — R7303 Prediabetes: Secondary | ICD-10-CM | POA: Diagnosis not present

## 2020-07-16 NOTE — Patient Instructions (Signed)
You can call and schedule your Dentist appointment at any of the following offices:   [facility] Address: [address]  [place], [place] [address] Phone #: [phone]  [facility] Address: [address] [place], [place] [address] Phone # [phone]  [facility], [facility]  Address: [address]                                                                 [place], [place] [address]       Phone #: [phone]   Preventive Care 43-43 Years Old, Female Preventive care refers to visits with your health care provider and lifestyle choices that can promote health and wellness. This includes:  A yearly physical exam. This may also be called an annual well check.  Regular dental visits and eye exams.  Immunizations.  Screening for certain conditions.  Healthy lifestyle choices, such as eating a healthy diet, getting regular exercise, not using drugs or products that contain nicotine and tobacco, and limiting alcohol use. What can I expect for my preventive care visit? Physical exam Your health care provider will check your:  Height and weight. This may be used to calculate body mass index (BMI), which tells if you are at a healthy weight.  Heart rate and blood pressure.  Skin for abnormal spots. Counseling Your health care provider may ask you questions about your:  Alcohol, tobacco, and drug use.  Emotional well-being.  Home and relationship well-being.  Sexual activity.  Eating habits.  Work and work Statistician.  Method of birth control.  Menstrual cycle.  Pregnancy history. What immunizations do I need?  Influenza (flu) vaccine  This is recommended every year. Tetanus, diphtheria, and pertussis (Tdap) vaccine  You may need a Td booster every 10 years. Varicella (chickenpox) vaccine  You may need this if you have not been vaccinated. Zoster (shingles) vaccine  You may need  this after age 14. Measles, mumps, and rubella (MMR) vaccine  You may need at least one dose of MMR if you were born in 1957 or later. You may also need a second dose. Pneumococcal conjugate (PCV13) vaccine  You may need this if you have certain conditions and were not previously vaccinated. Pneumococcal polysaccharide (PPSV23) vaccine  You may need one or two doses if you smoke cigarettes or if you have certain conditions. Meningococcal conjugate (MenACWY) vaccine  You may need this if you have certain conditions. Hepatitis A vaccine  You may need this if you have certain conditions or if you travel or work in places where you may be exposed to hepatitis A. Hepatitis B vaccine  You may need this if you have certain conditions or if you travel or work in places where you may be exposed to hepatitis B. Haemophilus influenzae type b (Hib) vaccine  You may need this if you have certain conditions. Human papillomavirus (HPV) vaccine  If recommended by your health care provider, you may need three doses over 6 months. You may receive vaccines as individual doses or as more than one vaccine together in one shot (combination vaccines). Talk with your health care provider about the risks and benefits of combination vaccines. What tests do I need? Blood tests  Lipid  and cholesterol levels. These may be checked every 5 years, or more frequently if you are over 51 years old.  Hepatitis C test.  Hepatitis B test. Screening  Lung cancer screening. You may have this screening every year starting at age 44 if you have a 30-pack-year history of smoking and currently smoke or have quit within the past 15 years.  Colorectal cancer screening. All adults should have this screening starting at age 77 and continuing until age 8. Your health care provider may recommend screening at age 6 if you are at increased risk. You will have tests every 1-10 years, depending on your results and the type of  screening test.  Diabetes screening. This is done by checking your blood sugar (glucose) after you have not eaten for a while (fasting). You may have this done every 1-3 years.  Mammogram. This may be done every 1-2 years. Talk with your health care provider about when you should start having regular mammograms. This may depend on whether you have a family history of breast cancer.  BRCA-related cancer screening. This may be done if you have a family history of breast, ovarian, tubal, or peritoneal cancers.  Pelvic exam and Pap test. This may be done every 3 years starting at age 20. Starting at age 23, this may be done every 5 years if you have a Pap test in combination with an HPV test. Other tests  Sexually transmitted disease (STD) testing.  Bone density scan. This is done to screen for osteoporosis. You may have this scan if you are at high risk for osteoporosis. Follow these instructions at home: Eating and drinking  Eat a diet that includes fresh fruits and vegetables, whole grains, lean protein, and low-fat dairy.  Take vitamin and mineral supplements as recommended by your health care provider.  Do not drink alcohol if: ? Your health care provider tells you not to drink. ? You are pregnant, may be pregnant, or are planning to become pregnant.  If you drink alcohol: ? Limit how much you have to 0-1 drink a day. ? Be aware of how much alcohol is in your drink. In the [place] one drink equals one 12 oz bottle of beer (355 mL), one 5 oz glass of wine (148 mL), or one 1 oz glass of hard liquor (44 mL). Lifestyle  Take daily care of your teeth and gums.  Stay active. Exercise for at least 30 minutes on 5 or more days each week.  Do not use any products that contain nicotine or tobacco, such as cigarettes, e-cigarettes, and chewing tobacco. If you need help quitting, ask your health care provider.  If you are sexually active, practice safe sex. Use a condom or other form of birth  control (contraception) in order to prevent pregnancy and STIs (sexually transmitted infections).  If told by your health care provider, take low-dose aspirin daily starting at age 45. What's next?  Visit your health care provider once a year for a well check visit.  Ask your health care provider how often you should have your eyes and teeth checked.  Stay up to date on all vaccines. This information is not intended to replace advice given to you by your health care provider. Make sure you discuss any questions you have with your health care provider. Document Revised: 07/27/2018 Document Reviewed: 07/27/2018 Elsevier Patient Education  2020 [employer].

## 2020-07-16 NOTE — Progress Notes (Signed)
Subjective:    Patient ID: Lacey Gibson, female    DOB: 03-02-1976, 44 y.o.   MRN: [id number]  HPI Chief Complaint  Patient presents with   cpe    fasting cpe, discuss weight issues   She is here for a complete physical exam. Last CPE: 2018  Other providers: OB/GYN  Prediabetes- last A1c 5.8%   HL- needs follow up on lipids. States she had labs done for her employer in the past 3 months and will bring in results.   Severe obesity- she has tried Phentermine in the past. Tried using an app to track her calories. No regular exercise. Is concerned about weight and would like help with weight loss.    Social history: Lives with her husband and a 64 year old, works for [employer], from home  Denies smoking and drug use, occasional alcohol.  Diet: high in sweets Excerise: no regular exercise. Gets steps in   Immunizations: Tdap more than 10 years ago   Health maintenance:  Mammogram: 06/18/2020 Colonoscopy: never  Last Gynecological Exam: 2020 No contraception  Last Dental Exam: 2 years  Last Eye Exam: years   Wears seatbelt always, smoke detectors in home and functioning, does not text while driving and feels safe in home environment.   Reviewed allergies, medications, past medical, surgical, family, and social history.   Review of Systems Review of Systems Constitutional: -fever, -chills, -sweats, -unexpected weight change,-fatigue ENT: -runny nose, -ear pain, -sore throat Cardiology:  -chest pain, -palpitations, -edema Respiratory: -cough, -shortness of breath, -wheezing Gastroenterology: -abdominal pain, -nausea, -vomiting, -diarrhea, -constipation  Hematology: -bleeding or bruising problems Musculoskeletal: -arthralgias, -myalgias, -joint swelling, -back pain Ophthalmology: -vision changes Urology: -dysuria, -difficulty urinating, -hematuria, -urinary frequency, -urgency Neurology: -headache, -weakness, -tingling, -numbness       Objective:    Physical Exam BP 124/80    Pulse 75    Ht 5' 8.25" (1.734 m)    Wt (!) 329 lb (149.2 kg)    BMI 49.66 kg/m   General Appearance:    Alert, cooperative, no distress, appears stated age  Head:    Normocephalic, without obvious abnormality, atraumatic  Eyes:    PERRL, conjunctiva/corneas clear, EOM's intact  Ears:    Normal TM's and external ear canals  Nose:   Mask on   Throat:   Mask on   Neck:   Supple, no lymphadenopathy;  thyroid:  no   enlargement/tenderness/nodules; no JVD  Back:    Spine nontender, no curvature, ROM normal, no CVA     tenderness  Lungs:     Clear to auscultation bilaterally without wheezes, rales or     ronchi; respirations unlabored  Chest Wall:    No tenderness or deformity   Heart:    Regular rate and rhythm, S1 and S2 normal, no murmur, rub   or gallop  Breast Exam:    OB/GYN  Abdomen:     Soft, non-tender, nondistended, normoactive bowel sounds,    no masses, no hepatosplenomegaly  Genitalia:    OB/GYN     Extremities:   No clubbing, cyanosis or edema  Pulses:   2+ and symmetric all extremities  Skin:   Skin color, texture, turgor normal, no rashes or lesions  Lymph nodes:   Cervical, supraclavicular, and axillary nodes normal  Neurologic:   CNII-XII intact, normal strength, sensation and gait; reflexes 2+ and symmetric throughout          Psych:   Normal mood, affect, hygiene and grooming.  Assessment & Plan:  Routine general medical examination at a health care facility -Preventive health care reviewed.  She sees her OB/GYN regularly.  Discussed healthy lifestyle including diet and exercise.  We also discussed immunizations and safety.  Morbid obesity (HCC) - Plan: Amb Ref to Medical Weight Management -she is motivated to lose weight. Has tried dieting, exercise and medication in the past. I will refer her to [facility] weight management and she is in agreement.   Prediabetes - Plan: Amb Ref to Medical Weight Management -she will bring in recent  labs done by her employer.   Hyperlipidemia, unspecified hyperlipidemia type - Plan: Amb Ref to Medical Weight Management -follow up pending lipid panel ordered by her employer. I will need to review these results when she brings them. Counseling on healthy diet and exercise.   Need for diphtheria-tetanus-pertussis (Tdap) vaccine - Plan: Tdap vaccine greater than or equal to 7yo IM -counseling was done on call components of vaccine

## 2020-07-18 ENCOUNTER — Telehealth: Payer: Self-pay | Admitting: Internal Medicine

## 2020-07-18 ENCOUNTER — Encounter: Payer: 59 | Admitting: Family Medicine

## 2020-07-18 NOTE — Telephone Encounter (Signed)
Pt states she was fasting when she had blood work done, does she need to come back in for more blood work since her glucose was elevated

## 2020-07-19 NOTE — Telephone Encounter (Signed)
Her blood sugar was mildly elevated so she may have prediabetes and we can bring her back in 4-6 weeks if she would like. I recommend cutting back to sugar and carbohydrates such as potatoes, bread, pasta and rice and increasing her physical activity.

## 2020-07-21 NOTE — Telephone Encounter (Signed)
Pt was notified of results

## 2020-07-25 ENCOUNTER — Encounter: Payer: Self-pay | Admitting: Family Medicine

## 2020-08-06 ENCOUNTER — Ambulatory Visit (INDEPENDENT_AMBULATORY_CARE_PROVIDER_SITE_OTHER): Payer: 59 | Admitting: Bariatrics

## 2020-08-06 ENCOUNTER — Other Ambulatory Visit: Payer: Self-pay

## 2020-08-06 ENCOUNTER — Encounter (INDEPENDENT_AMBULATORY_CARE_PROVIDER_SITE_OTHER): Payer: Self-pay | Admitting: Bariatrics

## 2020-08-06 VITALS — BP 124/77 | HR 77 | Temp 98.2°F | Ht 68.0 in | Wt 320.0 lb

## 2020-08-06 DIAGNOSIS — R0683 Snoring: Secondary | ICD-10-CM

## 2020-08-06 DIAGNOSIS — R5383 Other fatigue: Secondary | ICD-10-CM

## 2020-08-06 DIAGNOSIS — R7303 Prediabetes: Secondary | ICD-10-CM

## 2020-08-06 DIAGNOSIS — E559 Vitamin D deficiency, unspecified: Secondary | ICD-10-CM

## 2020-08-06 DIAGNOSIS — Z1331 Encounter for screening for depression: Secondary | ICD-10-CM

## 2020-08-06 DIAGNOSIS — Z6841 Body Mass Index (BMI) 40.0 and over, adult: Secondary | ICD-10-CM

## 2020-08-06 DIAGNOSIS — R0602 Shortness of breath: Secondary | ICD-10-CM | POA: Diagnosis not present

## 2020-08-06 DIAGNOSIS — Z0289 Encounter for other administrative examinations: Secondary | ICD-10-CM

## 2020-08-06 DIAGNOSIS — E785 Hyperlipidemia, unspecified: Secondary | ICD-10-CM | POA: Diagnosis not present

## 2020-08-06 DIAGNOSIS — Z9189 Other specified personal risk factors, not elsewhere classified: Secondary | ICD-10-CM

## 2020-08-07 ENCOUNTER — Encounter (INDEPENDENT_AMBULATORY_CARE_PROVIDER_SITE_OTHER): Payer: Self-pay | Admitting: Bariatrics

## 2020-08-07 DIAGNOSIS — E559 Vitamin D deficiency, unspecified: Secondary | ICD-10-CM | POA: Insufficient documentation

## 2020-08-07 LAB — COMPREHENSIVE METABOLIC PANEL
ALT: 17 IU/L (ref 0–32)
AST: 20 IU/L (ref 0–40)
Albumin/Globulin Ratio: 1.1 — ABNORMAL LOW (ref 1.2–2.2)
Albumin: 3.8 g/dL (ref 3.8–4.8)
Alkaline Phosphatase: 72 IU/L (ref 48–121)
BUN/Creatinine Ratio: 8 — ABNORMAL LOW (ref 9–23)
BUN: 8 mg/dL (ref 6–24)
Bilirubin Total: 0.3 mg/dL (ref 0.0–1.2)
CO2: 22 mmol/L (ref 20–29)
Calcium: 9.2 mg/dL (ref 8.7–10.2)
Chloride: 105 mmol/L (ref 96–106)
Creatinine, Ser: 0.95 mg/dL (ref 0.57–1.00)
GFR calc Af Amer: 85 mL/min/{1.73_m2} (ref >59)
GFR calc non Af Amer: 74 mL/min/{1.73_m2} (ref >59)
Globulin, Total: 3.6 g/dL (ref 1.5–4.5)
Glucose: 88 mg/dL (ref 65–99)
Potassium: 5 mmol/L (ref 3.5–5.2)
Sodium: 139 mmol/L (ref 134–144)
Total Protein: 7.4 g/dL (ref 6.0–8.5)

## 2020-08-07 LAB — LIPID PANEL WITH LDL/HDL RATIO
Cholesterol, Total: 205 mg/dL — ABNORMAL HIGH (ref 100–199)
HDL: 67 mg/dL (ref >39)
LDL Chol Calc (NIH): 122 mg/dL — ABNORMAL HIGH (ref 0–99)
LDL/HDL Ratio: 1.8 ratio (ref 0.0–3.2)
Triglycerides: 87 mg/dL (ref 0–149)
VLDL Cholesterol Cal: 16 mg/dL (ref 5–40)

## 2020-08-07 LAB — T3: T3, Total: 125 ng/dL (ref 71–180)

## 2020-08-07 LAB — TSH: TSH: 3 u[IU]/mL (ref 0.450–4.500)

## 2020-08-07 LAB — INSULIN, RANDOM: INSULIN: 33.8 u[IU]/mL — ABNORMAL HIGH (ref 2.6–24.9)

## 2020-08-07 LAB — HEMOGLOBIN A1C
Est. average glucose Bld gHb Est-mCnc: 108 mg/dL
Hgb A1c MFr Bld: 5.4 % (ref 4.8–5.6)

## 2020-08-07 LAB — T4, FREE: Free T4: 1.12 ng/dL (ref 0.82–1.77)

## 2020-08-07 LAB — VITAMIN D 25 HYDROXY (VIT D DEFICIENCY, FRACTURES): Vit D, 25-Hydroxy: 17.5 ng/mL — ABNORMAL LOW (ref 30.0–100.0)

## 2020-08-11 NOTE — Progress Notes (Signed)
Dear [name], NP-C,   Thank you for referring Lacey Gibson to our clinic. The following note includes my evaluation and treatment recommendations.  Chief Complaint:   OBESITY Lacey Gibson (MR# [id number]) is a 44 y.o. female who presents for evaluation and treatment of obesity and related comorbidities. Current BMI is Body mass index is 48.66 kg/m. Lacey Gibson has been struggling with her weight for many years and has been unsuccessful in either losing weight, maintaining weight loss, or reaching her healthy weight goal.  Lacey Gibson is currently in the action stage of change and ready to dedicate time achieving and maintaining a healthier weight. Lacey Gibson is interested in becoming our patient and working on intensive lifestyle modifications including (but not limited to) diet and exercise for weight loss.  Lacey Gibson says she sometimes craves sweets and comfort foods; usually around 5-8 pm.  Lacey Gibson's habits were reviewed today and are as follows: Her family eats meals together, she thinks her family will eat healthier with her, she struggles with family and or coworkers weight loss sabotage, her desired weight loss is 70 pounds, she has been heavy most of her life, she started gaining weight in 2017, her heaviest weight ever was 335 pounds, she craves sweets and comfort foods, she snacks frequently in the evenings, she is frequently drinking liquids with calories, she frequently makes poor food choices, she frequently eats larger portions than normal and she struggles with emotional eating.  Depression Screen Lacey Gibson's Food and Mood (modified PHQ-9) score was 16.  Depression screen PHQ 2/9 08/06/2020  Decreased Interest 2  Down, Depressed, Hopeless 2  PHQ - 2 Score 4  Altered sleeping 3  Tired, decreased energy 2  Change in appetite 2  Feeling bad or failure about yourself  1  Trouble concentrating 3  Moving slowly or fidgety/restless 0  Suicidal thoughts 1  PHQ-9  Score 16  Difficult doing work/chores Somewhat difficult   Subjective:   1. Other fatigue Lacey Gibson admits to daytime somnolence and reports waking up still tired. Patent has a history of symptoms of daytime fatigue, morning fatigue and snoring. Lacey Gibson generally gets 6 hours of sleep per night, and states that she has poor quality sleep. Snoring is present. Apneic episodes are not present. Epworth Sleepiness Score is 2.  2. SOB (shortness of breath) on exertion Lacey Gibson notes increasing shortness of breath with exercising and seems to be worsening over time with weight gain. She notes getting out of breath sooner with activity than she used to. This has gotten worse recently. Lacey Gibson denies shortness of breath at rest or orthopnea.  3. Prediabetes Lacey Gibson has a diagnosis of prediabetes based on her elevated HgA1c and was informed this puts her at greater risk of developing diabetes. She continues to work on diet and exercise to decrease her risk of diabetes. She denies nausea or hypoglycemia.  She is on no medications.  She says her appetite is low.  Lab Results  Component Value Date   HGBA1C 5.4 08/06/2020   Lab Results  Component Value Date   INSULIN 33.8 (H) 08/06/2020   4. Hyperlipidemia, unspecified hyperlipidemia type Lacey Gibson has hyperlipidemia and has been trying to improve her cholesterol levels with intensive lifestyle modification including a low saturated fat diet, exercise and weight loss. She denies any chest pain, claudication or myalgias.  She is on no medications.  Lab Results  Component Value Date   ALT 17 08/06/2020   AST 20 08/06/2020   ALKPHOS 72 08/06/2020   BILITOT  0.3 08/06/2020   Lab Results  Component Value Date   CHOL 205 (H) 08/06/2020   HDL 67 08/06/2020   LDLCALC 122 (H) 08/06/2020   TRIG 87 08/06/2020   CHOLHDL 3.0 11/17/2017   5. Vitamin D deficiency She is currently taking no vitamin D supplement. She denies nausea, vomiting or muscle  weakness.  6. Snoring [name] has non-restorative sleep.  Epworth score is 2.  7. Depression screening Lacey Gibson was screened for depression as part of her new patient workup.  8. At risk for diabetes mellitus Lacey Gibson is at higher than average risk for developing diabetes due to her obesity and prediabetes.   Assessment/Plan:   1. Other fatigue Lacey Gibson does feel that her weight is causing her energy to be lower than it should be. Fatigue may be related to obesity, depression or many other causes. Labs will be ordered, and in the meanwhile, Lacey Gibson will focus on self care including making healthy food choices, increasing physical activity and focusing on stress reduction.  - EKG 12-Lead - Comprehensive metabolic panel - Hemoglobin A1c - Insulin, random - Lipid Panel With LDL/HDL Ratio - VITAMIN D 25 Hydroxy (Vit-D Deficiency, Fractures) - TSH - T4, free - T3  2. SOB (shortness of breath) on exertion Carman does feel that she gets out of breath more easily that she used to when she exercises. Lacey Gibson's shortness of breath appears to be obesity related and exercise induced. She has agreed to work on weight loss and gradually increase exercise to treat her exercise induced shortness of breath. Will continue to monitor closely.  - Lipid Panel With LDL/HDL Ratio  3. Prediabetes Ruthe will continue to work on weight loss, exercise, and decreasing simple carbohydrates to help decrease the risk of diabetes.  Decrease carbohydrates, increase healthy fats and protein, and we will check labs today.  - Hemoglobin A1c - Insulin, random  4. Hyperlipidemia, unspecified hyperlipidemia type Lacey Gibson has hyperlipidemia and has been trying to improve her cholesterol levels with intensive lifestyle modification including a low saturated fat diet, exercise and weight loss. She denies any chest pain, claudication or myalgias.  Eliminate trans fat.  Will check lipid panel today.  Lab Results   Component Value Date   ALT 17 08/06/2020   AST 20 08/06/2020   ALKPHOS 72 08/06/2020   BILITOT 0.3 08/06/2020   Lab Results  Component Value Date   CHOL 205 (H) 08/06/2020   HDL 67 08/06/2020   LDLCALC 122 (H) 08/06/2020   TRIG 87 08/06/2020   CHOLHDL 3.0 11/17/2017   - Lipid Panel With LDL/HDL Ratio  5. Vitamin D deficiency Low Vitamin D level contributes to fatigue and are associated with obesity, breast, and colon cancer.  Recommend she try gummy vitamins.  Will check vitamin D level today.  - VITAMIN D 25 Hydroxy (Vit-D Deficiency, Fractures)  6. Snoring Will send Yesha to Sleep Medicine for evaluation.  7. Depression screening [place] had a positive depression screening. Depression is commonly associated with obesity and often results in emotional eating behaviors. We will monitor this closely and work on CBT to help improve the non-hunger eating patterns. Referral to Psychology may be required if no improvement is seen as she continues in our clinic.  8. At risk for diabetes mellitus Ramaya was given approximately 15 minutes of diabetes education and counseling today. We discussed intensive lifestyle modifications today with an emphasis on weight loss as well as increasing exercise and decreasing simple carbohydrates in her diet. We also reviewed medication  options with an emphasis on risk versus benefit of those discussed.   Repetitive spaced learning was employed today to elicit superior memory formation and behavioral change.  9. Class 3 severe obesity with serious comorbidity and body mass index (BMI) of 45.0 to 49.9 in adult, unspecified obesity type (HCC) Rashidah is currently in the action stage of change and her goal is to continue with weight loss efforts. I recommend Maia begin the structured treatment plan as follows:  She has agreed to the Category 2 Plan.  She will work on meal planning and intentional eating.  Exercise goals: No exercise has been  prescribed at this time.   Behavioral modification strategies: increasing lean protein intake, decreasing simple carbohydrates, increasing vegetables, increasing water intake, decreasing eating out, no skipping meals, meal planning and cooking strategies, keeping healthy foods in the home and planning for success.  She was informed of the importance of frequent follow-up visits to maximize her success with intensive lifestyle modifications for her multiple health conditions. She was informed we would discuss her lab results at her next visit unless there is a critical issue that needs to be addressed sooner. Villa agreed to keep her next visit at the agreed upon time to discuss these results.  Objective:   Blood pressure 124/77, pulse 77, temperature 98.2 F (36.8 C), height 5\' 8"  (1.727 m), weight (!) 320 lb (145.2 kg), last menstrual period 07/08/2020, SpO2 94 %. Body mass index is 48.66 kg/m.  EKG: Normal sinus rhythm, rate 70 bpm.  Indirect Calorimeter completed today shows a VO2 of 241 and a REE of 1674.  Her calculated basal metabolic rate is 09/07/2020 thus her basal metabolic rate is worse than expected.  General: Cooperative, alert, well developed, in no acute distress. HEENT: Conjunctivae and lids unremarkable. Cardiovascular: Regular rhythm.  Lungs: Normal work of breathing. Neurologic: No focal deficits.   Lab Results  Component Value Date   CREATININE 0.95 08/06/2020   BUN 8 08/06/2020   NA 139 08/06/2020   K 5.0 08/06/2020   CL 105 08/06/2020   CO2 22 08/06/2020   Lab Results  Component Value Date   ALT 17 08/06/2020   AST 20 08/06/2020   ALKPHOS 72 08/06/2020   BILITOT 0.3 08/06/2020   Lab Results  Component Value Date   HGBA1C 5.4 08/06/2020   HGBA1C 5.7 (H) 12/18/2018   HGBA1C 5.7 (H) 11/17/2017   Lab Results  Component Value Date   INSULIN 33.8 (H) 08/06/2020   Lab Results  Component Value Date   TSH 3.000 08/06/2020   Lab Results  Component Value  Date   CHOL 205 (H) 08/06/2020   HDL 67 08/06/2020   LDLCALC 122 (H) 08/06/2020   TRIG 87 08/06/2020   CHOLHDL 3.0 11/17/2017   Lab Results  Component Value Date   WBC 7.7 12/18/2018   HGB 11.2 12/18/2018   HCT 34.9 12/18/2018   MCV 70 (L) 12/18/2018   PLT 346 12/18/2018   Attestation Statements:   Reviewed by clinician on day of visit: allergies, medications, problem list, medical history, surgical history, family history, social history, and previous encounter notes.  I, 12/20/2018, CMA, am acting as Insurance claims handler for Energy manager, DO  I have reviewed the above documentation for accuracy and completeness, and I agree with the above. [employer], DO

## 2020-08-12 ENCOUNTER — Encounter (INDEPENDENT_AMBULATORY_CARE_PROVIDER_SITE_OTHER): Payer: Self-pay | Admitting: Bariatrics

## 2020-08-20 ENCOUNTER — Encounter (INDEPENDENT_AMBULATORY_CARE_PROVIDER_SITE_OTHER): Payer: Self-pay | Admitting: Bariatrics

## 2020-08-20 ENCOUNTER — Ambulatory Visit (INDEPENDENT_AMBULATORY_CARE_PROVIDER_SITE_OTHER): Payer: 59 | Admitting: Bariatrics

## 2020-08-20 ENCOUNTER — Other Ambulatory Visit: Payer: Self-pay

## 2020-08-20 VITALS — BP 119/76 | HR 68 | Temp 98.0°F | Ht 68.0 in | Wt 316.0 lb

## 2020-08-20 DIAGNOSIS — E559 Vitamin D deficiency, unspecified: Secondary | ICD-10-CM

## 2020-08-20 DIAGNOSIS — E8881 Metabolic syndrome: Secondary | ICD-10-CM

## 2020-08-20 DIAGNOSIS — Z6841 Body Mass Index (BMI) 40.0 and over, adult: Secondary | ICD-10-CM | POA: Diagnosis not present

## 2020-08-20 DIAGNOSIS — Z9189 Other specified personal risk factors, not elsewhere classified: Secondary | ICD-10-CM

## 2020-08-20 MED ORDER — VITAMIN D (ERGOCALCIFEROL) 1.25 MG (50000 UNIT) PO CAPS: 50000.0000 [IU] | ORAL_CAPSULE | ORAL | 0 refills | Status: DC

## 2020-08-25 NOTE — Progress Notes (Signed)
Chief Complaint:   OBESITY Lacey Gibson is here to discuss her progress with her obesity treatment plan along with follow-up of her obesity related diagnoses. Lacey Gibson is on the Category 2 Plan and states she is following her eating plan approximately 80% of the time. Lacey Gibson states she is walking for 30 minutes 5 times per week.  Today's visit was #: 2 Starting weight: 320 lbs Starting date: 08/06/2020 Today's weight: 316 lbs Today's date: 08/20/2020 Total lbs lost to date: 4 lbs Total lbs lost since last in-office visit: 4 lbs  Interim History: Lacey Gibson is down 4 pounds and doing well overall.  She says she gets a little hungry in the evenings.  Subjective:   1. Vitamin D deficiency Lacey Gibson's Vitamin D level was 17.5 on 08/06/2020. She is currently taking no vitamin D supplement. She denies nausea, vomiting or muscle weakness.  2. Insulin resistance Lacey Gibson has a diagnosis of insulin resistance based on her elevated fasting insulin level >5. She continues to work on diet and exercise to decrease her risk of diabetes.  Denies polyphagia.  Lab Results  Component Value Date   INSULIN 33.8 (H) 08/06/2020   Lab Results  Component Value Date   HGBA1C 5.4 08/06/2020   3. At risk for osteoporosis Lacey Gibson is at higher risk of osteopenia and osteoporosis due to Vitamin D deficiency.   Assessment/Plan:   1. Vitamin D deficiency Low Vitamin D level contributes to fatigue and are associated with obesity, breast, and colon cancer. She agrees to start to take prescription Vitamin D @50 ,000 IU every week and will follow-up for routine testing of Vitamin D, at least 2-3 times per year to avoid over-replacement.  Will refill today.  2. Insulin resistance Lacey Gibson will continue to work on weight loss, exercise, and decreasing simple carbohydrates to help decrease the risk of diabetes. Lacey Gibson agreed to follow-up with Lacey Gibson as directed to closely monitor her progress.  Handout given:  IR and  prediabetes.  3. At risk for osteoporosis Lacey Gibson was given approximately 15 minutes of osteoporosis prevention counseling today. Lacey Gibson is at risk for osteopenia and osteoporosis due to her Vitamin D deficiency. She was encouraged to take her Vitamin D and follow her higher calcium diet and increase strengthening exercise to help strengthen her bones and decrease her risk of osteopenia and osteoporosis.  Repetitive spaced learning was employed today to elicit superior memory formation and behavioral change.  4. Class 3 severe obesity with serious comorbidity and body mass index (BMI) of 45.0 to 49.9 in adult, unspecified obesity type (HCC)  Lacey Gibson is currently in the action stage of change. As such, her goal is to continue with weight loss efforts. She has agreed to the Category 2 Plan.   She will work on meal planning.  Labs from 08/06/2020 were reviewed including CMP, lipids, vitamin D, A1c, insulin, and thyroid panel.  Will not skip meals af able.  Increase water intake to about 64 ounces per day.  Exercise goals: For substantial health benefits, adults should do at least 150 minutes (2 hours and 30 minutes) a week of moderate-intensity, or 75 minutes (1 hour and 15 minutes) a week of vigorous-intensity aerobic physical activity, or an equivalent combination of moderate- and vigorous-intensity aerobic activity. Aerobic activity should be performed in episodes of at least 10 minutes, and preferably, it should be spread throughout the week.  Behavioral modification strategies: increasing lean protein intake, decreasing simple carbohydrates, increasing vegetables, increasing water intake, decreasing eating out, no skipping meals,  meal planning and cooking strategies, keeping healthy foods in the home and planning for success.  Kassadi has agreed to follow-up with our clinic in 3 weeks. She was informed of the importance of frequent follow-up visits to maximize her success with intensive  lifestyle modifications for her multiple health conditions.   Objective:   Blood pressure 119/76, pulse 68, temperature 98 F (36.7 C), height 5\' 8"  (1.727 m), weight (!) 316 lb (143.3 kg), SpO2 99 %. Body mass index is 48.05 kg/m.  General: Cooperative, alert, well developed, in no acute distress. HEENT: Conjunctivae and lids unremarkable. Cardiovascular: Regular rhythm.  Lungs: Normal work of breathing. Neurologic: No focal deficits.   Lab Results  Component Value Date   CREATININE 0.95 08/06/2020   BUN 8 08/06/2020   NA 139 08/06/2020   K 5.0 08/06/2020   CL 105 08/06/2020   CO2 22 08/06/2020   Lab Results  Component Value Date   ALT 17 08/06/2020   AST 20 08/06/2020   ALKPHOS 72 08/06/2020   BILITOT 0.3 08/06/2020   Lab Results  Component Value Date   HGBA1C 5.4 08/06/2020   HGBA1C 5.7 (H) 12/18/2018   HGBA1C 5.7 (H) 11/17/2017   Lab Results  Component Value Date   INSULIN 33.8 (H) 08/06/2020   Lab Results  Component Value Date   TSH 3.000 08/06/2020   Lab Results  Component Value Date   CHOL 205 (H) 08/06/2020   HDL 67 08/06/2020   LDLCALC 122 (H) 08/06/2020   TRIG 87 08/06/2020   CHOLHDL 3.0 11/17/2017   Lab Results  Component Value Date   WBC 7.7 12/18/2018   HGB 11.2 12/18/2018   HCT 34.9 12/18/2018   MCV 70 (L) 12/18/2018   PLT 346 12/18/2018   Attestation Statements:   Reviewed by clinician on day of visit: allergies, medications, problem list, medical history, surgical history, family history, social history, and previous encounter notes.  I, 12/20/2018, CMA, am acting as Insurance claims handler for Energy manager, DO  I have reviewed the above documentation for accuracy and completeness, and I agree with the above. [employer], DO

## 2020-08-26 ENCOUNTER — Encounter (INDEPENDENT_AMBULATORY_CARE_PROVIDER_SITE_OTHER): Payer: Self-pay | Admitting: Bariatrics

## 2020-09-02 ENCOUNTER — Encounter (INDEPENDENT_AMBULATORY_CARE_PROVIDER_SITE_OTHER): Payer: Self-pay | Admitting: Bariatrics

## 2020-09-11 ENCOUNTER — Ambulatory Visit (INDEPENDENT_AMBULATORY_CARE_PROVIDER_SITE_OTHER): Payer: 59 | Admitting: Bariatrics

## 2021-05-27 ENCOUNTER — Encounter: Payer: Self-pay | Admitting: Internal Medicine

## 2021-06-04 ENCOUNTER — Encounter: Payer: Self-pay | Admitting: Family Medicine

## 2021-06-10 NOTE — Progress Notes (Signed)
Chief Complaint  Patient presents with   Consult    Here to discuss form completion for bariatric surgery.    Patient needs forms filled out for proceeding with gastric sleeve bariatric surgery  Saw [facility] 2x in 07/2020, issues with cost/insurance coverage. She brings in forms from [facility], with need for documentation of other diets/meds/treatments tried.  She has tried Phentermine and HCG injections through a weight loss clinic in 2016 (when first moved to [place]). She lost 20# (regained it). In 2018 she tried Nutrisystem, recalls 10# wt loss She tried Slimfast twice, in 2020 and early 2021. She tried [employer] in 2021, no wt loss.  She reports having tried Contrave in 2020, and Qsymia as well, over 5 years ago.  She has used apps to track calories.  Her current exercise involves walking in the park, and doing exercise YouTube videos.  In discussing/reviewing chart regarding any complications related to her obesity:  Borderline prediabetes noted, with A1c 5.7 in 2020 (improved on last check in 07/2020).  Insulin resistance (elevated level in 07/2020)  She hasn't been evaluated for sleep apnea, reports discussing this though.  She knows that she snores, has woken herself up snoring. Sometimes she wakes up with a headache. She has some unrefreshed sleep, not every night, at least 1x/week. +daytime somnolence, not daily  DOE at hills, worse in the heat. No problems when walking flat (no problem walking around the track at the park)  She has issues with heartburn, reports "never diagnosed". Infrequent, just related to certain foods. Hasn't used any medications.  She has had problems with R knee pain, and intermittent back pain, not to the point where she sought care, or had x-rays or took medications.  PMH, PSH, SH reviewed  Outpatient Encounter Medications as of 06/11/2021  Medication Sig   cholecalciferol (VITAMIN D3) 25 MCG (1000 UNIT) tablet Take 1,000 Units by mouth daily.    Nutritional Supplements (ESTROVEN PO) Take 1 tablet by mouth daily.   [DISCONTINUED] Vitamin D, Ergocalciferol, (DRISDOL) 1.25 MG (50000 UNIT) CAPS capsule Take 1 capsule (50,000 Units total) by mouth every 7 (seven) days.   No facility-administered encounter medications on file as of 06/11/2021.   No Known Allergies  ROS: No f/cn/v/d/abd pain/urinary complaints Some intermittent anxiety/stress Intermittent knee, back pain, heartburn, per HPI Intermittent unrefreshed sleep and daytime somnolence per HPI.   PHYSICAL EXAM:  BP 132/80   Pulse 80   Ht 5\' 8"  (1.727 m)   Wt (!) 326 lb 9.6 oz (148.1 kg)   BMI 49.66 kg/m   Wt Readings from Last 3 Encounters:  06/11/21 (!) 326 lb 9.6 oz (148.1 kg)  08/20/20 (!) 316 lb (143.3 kg)  08/06/20 (!) 320 lb (145.2 kg)   Well-appearing, pleasant female, in good spirits. She is alert, oriented. Normal mood, affect, hygiene and grooming. No exam performed, limited to discussion.   ASSESSMENT/PLAN:  Encounter for weight loss counseling - Discussed diet, exercise, medications, surgery and complications of obesity. Questions answered. Will look into cost of meds prior to surgery eval  Class 3 severe obesity with serious comorbidity and body mass index (BMI) of 45.0 to 49.9 in adult, unspecified obesity type (HCC) - comorbid include poss OSA--get sleep study, LBP, knee pain (suspect OA), intermittent GERD, insulin resistance, DOE. Discussed 10/06/20 - Plan: Home sleep test  Snoring - with some daytime somnolence, unrefreshed sleep, and morbid obesity.  Check sleep study to eval for OSA - Plan: Home sleep test  Insulin resistance -  Discussed what this means, what can be done to improve this (exercise, medications, wt loss)  Forms filled out, letter of medical necessity. Referred for sleep study  When directly asked my opinion, given that there is often re-gain even after surgery, I encouraged her to look into Wegovy/Mounjaro injections as  a treatment that she hasn't tried that likely will be helpful, if affordable.  I recommended proceeding with surgical evaluation if this is not an option for her.   I spent 43 minutes dedicated to the care of this patient, including pre-visit review of records, face to face time, post-visit ordering of testing and documentation.

## 2021-06-11 ENCOUNTER — Ambulatory Visit (INDEPENDENT_AMBULATORY_CARE_PROVIDER_SITE_OTHER): Payer: No Typology Code available for payment source | Admitting: Family Medicine

## 2021-06-11 ENCOUNTER — Other Ambulatory Visit: Payer: Self-pay

## 2021-06-11 VITALS — BP 132/80 | HR 80 | Ht 68.0 in | Wt 326.6 lb

## 2021-06-11 DIAGNOSIS — E8881 Metabolic syndrome: Secondary | ICD-10-CM

## 2021-06-11 DIAGNOSIS — R0683 Snoring: Secondary | ICD-10-CM

## 2021-06-11 DIAGNOSIS — Z713 Dietary counseling and surveillance: Secondary | ICD-10-CM | POA: Diagnosis not present

## 2021-06-11 DIAGNOSIS — Z6841 Body Mass Index (BMI) 40.0 and over, adult: Secondary | ICD-10-CM

## 2021-06-11 NOTE — Patient Instructions (Addendum)
I am writing the letter of necessity. I think that you have lots of other options to try before considering surgery, but surgery is definitely an option that should be kept on the table.  There are some injectable medications--Mounjaro is the newest (most effective, once weekly injection), Wegovy (weekly injection, some supply chain issues), Saxenda (daily injection).   Other oral medications include Qsymia, Contrave  Check to see about cost/coverage for these injectable medications and return for further office visit for discussion/teaching and prescription if affordable and desired.  Insulin Resistance  Insulin is a hormone that is made by the pancreas. Insulin allows blood sugar (glucose) to enter the cells in the body. Insulin helps the body use glucose for energy. Normally, the body is insulin sensitive, which means the cells in the body are effective at absorbing glucose. Insulin resistance is when the cells in the body do not respond properly to insulin and are not able to absorb glucose. The pancreas makes more insulin, but over time the body cannot makeenough insulin to keep glucose at normal levels. Insulin resistance results in high blood glucose levels (hyperglycemia) and can lead to problems, including: Prediabetes. Type 2 diabetes (diabetes mellitus). Heart disease. High blood pressure (hypertension). Stroke. Polycystic ovary syndrome (PCOS). Nonalcoholic fatty liver disease. What are the causes? The exact cause of insulin resistance is not known. What increases the risk? The following factors may make you more likely to develop insulin resistance: Being overweight or obese, especially if a lot of your weight is in your waist area. Having an inactive (sedentary) lifestyle. Having above-normal glucose levels. Having abnormal cholesterol levels. Having sleep apnea. Being older than age 13. Using steroids. What are the signs or symptoms? This condition usually does not cause  symptoms. A waist measurement of more than 35 inches (88.9 cm) for women and more than 40 inches (101.6 cm) for menmay be a sign of insulin resistance. How is this diagnosed? There is no test to diagnose insulin resistance. However, your health care provider may diagnose insulin resistance based on: A physical exam. Your medical history. Blood tests that check your blood glucose level. How is this treated? Insulin resistance is treated with nutrition and lifestyle changes. These changes may include: Eating a healthy balance of nutritious foods. Getting more physical activity. Maintaining a healthy weight. Stopping the use of any tobacco products. Your health care provider will work with you to change your nutrition and lifestyle as needed. In some cases, treatment may also include medicine toimprove your insulin sensitivity. Follow these instructions at home: Activity Be physically active. Do moderate-intensity physical activity for at least 30 minutes on 5 or more days of the week, or as told by your health care provider. This could include brisk walking, biking, or water aerobics. Ask your health care provider what activities are safe for you. A mix of physical activities may be best, such as walking, swimming, biking, and strength training. Eating and drinking  Follow a healthy meal plan. This includes eating: Lean proteins. Complex carbohydrates. Examples of these include whole grains, starchy vegetables (potatoes, corn, peas), and beans. Fresh fruits and vegetables. Low-fat dairy products. Healthy fats. Follow instructions from your health care provider about eating or drinking restrictions. Make an appointment to see a diet and nutrition specialist (registered dietitian) to help you create a healthy eating plan.  General instructions Check your blood glucose levels as told by your health care provider. Take over-the-counter and prescription medicines only as told by your health  care  provider. Lose weight as told by your health care provider. Losing 5-7% of your body weight can reverse insulin resistance. Your health care provider can determine how much weight loss is best for you and can help you lose weight safely. Do not use any products that contain nicotine or tobacco. These products include cigarettes, chewing tobacco, and vaping devices, such as e-cigarettes. If you need help quitting, ask your health care provider. Keep all follow-up visits. This is important. Contact a health care provider if: You have trouble losing weight or maintaining your goal weight. You gain weight. You have trouble following your prescribed meal plan. You have trouble exercising more. Summary Insulin resistance occurs when cells in the body do not respond properly to insulin and are not able to absorb blood sugar (glucose). The body makes more insulin, but over time the body cannot make enough insulin to keep blood sugar at normal levels. Insulin resistance is treated with nutrition and lifestyle changes, including eating a healthy balance of nutritious foods, getting more physical activity, and maintaining a healthy weight. Your health care provider will work with you to change your nutrition and lifestyle as needed. Treatment may also include medicine to improve your insulin sensitivity. Check your blood glucose levels as told by your health care provider. Keep all follow-up visits. This is important. This information is not intended to replace advice given to you by your health care provider. Make sure you discuss any questions you have with your healthcare provider. Document Revised: 08/04/2020 Document Reviewed: 08/04/2020 Elsevier Patient Education  2022 [employer].

## 2021-06-12 ENCOUNTER — Encounter: Payer: Self-pay | Admitting: Family Medicine

## 2021-06-12 DIAGNOSIS — E8881 Metabolic syndrome: Secondary | ICD-10-CM | POA: Insufficient documentation

## 2021-06-16 ENCOUNTER — Encounter: Payer: Self-pay | Admitting: Family Medicine

## 2021-06-17 NOTE — Progress Notes (Signed)
Chief Complaint  Patient presents with   Follow-up    Follow up to start [facility].     Patient presents to start [facility]. She was seen last week for discussion/letter of necessity for bariatric surgery. She decided that she would like to try [facility] prior to proceeding with surgery. She has only been treated with oral meds, never injectable (not tried [place], [place] or [place]).  She got a lot of information from a FB group, as far as frequent side effects, setc.  She currently is using withdrawal method of contraception.  Does not desire pregnancy right now.  PMH, PSH, SH and FH briefly reviewed  Outpatient Encounter Medications as of 06/18/2021  Medication Sig   cholecalciferol (VITAMIN D3) 25 MCG (1000 UNIT) tablet Take 1,000 Units by mouth daily.   Nutritional Supplements (ESTROVEN PO) Take 1 tablet by mouth daily.   No facility-administered encounter medications on file as of 06/18/2021.   No Known Allergies  ROS: no f/c/n/v/d, heartburn, URI symptoms, or other new concerns. See HPI.    PHYSICAL EXAM:  BP 120/80   Pulse 80   Ht 5\' 8"  (1.727 m)   Wt (!) 325 lb 9.6 oz (147.7 kg)   LMP 06/07/2021 (Exact Date)   BMI 49.51 kg/m   Wt Readings from Last 3 Encounters:  06/18/21 (!) 325 lb 9.6 oz (147.7 kg)  06/11/21 (!) 326 lb 9.6 oz (148.1 kg)  08/20/20 (!) 316 lb (143.3 kg)   Pleasant, obese female, in good spirits. She is alert, oriented, normal gait, normal mood.   ASSESSMENT/PLAN:   Given 4 weeks of 2.5mg  dose.  Given first dose in office, shown by nurse.  We discussed usual dosing (2.5mg  [place] qwk x 4 wks, then 5mg  for at least 4 weeks, with potential to increase further)  Pt will f/u in 4 weeks.  We discussed common SE --nausea, decreased appetite, diarrhea (constipation also listed) Tachycardia, vomiting, dyspepsia, abdominal pain. More serious risks--gallstones, pancreatitis, AKI.   Delays gastric emptying, so can alter absorption of concomitant oral  drugs. Specifically we discussed need for condoms if on OCP's. She is not currently using adequate contraception.  Discussed regular use of condoms, or consider alternative contraception. Discussed potential teratogenicity seen with animals.  Counseled re: healthy diet, portions, need for small/frequent meals to prevent nausea.  I spent 34 minutes dedicated to the care of this patient, including pre-visit review of records, face to face time, post-visit ordering of testing and documentation. Most of visit was spent in counseling re: medication and diet.   Please use condoms while taking this medication.  Withdrawal method isn't the most effective contraception, and pregnancy should be avoided while on this medication--there aren't a lot of human studies, but there is potential harm to the fetus based on animal studies. Other contraceptive options would also be fine (although with birth control pills, you also need to use condoms for a month with each dose change, as the medication can interfere with the effectiveness of the pills).  We discussed limiting portions sizes (eating more frequently, if needed), be sure to get adequate protein intake and limit your carbs. Stay well hydrated.

## 2021-06-18 ENCOUNTER — Encounter: Payer: Self-pay | Admitting: Family Medicine

## 2021-06-18 ENCOUNTER — Ambulatory Visit (INDEPENDENT_AMBULATORY_CARE_PROVIDER_SITE_OTHER): Payer: No Typology Code available for payment source | Admitting: Family Medicine

## 2021-06-18 ENCOUNTER — Other Ambulatory Visit: Payer: Self-pay

## 2021-06-18 VITALS — BP 120/80 | HR 80 | Ht 68.0 in | Wt 325.6 lb

## 2021-06-18 DIAGNOSIS — Z6841 Body Mass Index (BMI) 40.0 and over, adult: Secondary | ICD-10-CM | POA: Diagnosis not present

## 2021-06-18 MED ORDER — TIRZEPATIDE 5 MG/0.5ML ~~LOC~~ SOAJ: 5.0000 mg | SUBCUTANEOUS | 1 refills | Status: DC

## 2021-06-18 MED ORDER — TIRZEPATIDE 2.5 MG/0.5ML ~~LOC~~ SOAJ: 2.5000 mg | SUBCUTANEOUS | 0 refills | Status: AC

## 2021-06-18 NOTE — Patient Instructions (Signed)
  Please use condoms while taking this medication.  Withdrawal method isn't the most effective contraception, and pregnancy should be avoided while on this medication--there aren't a lot of human studies, but there is potential harm to the fetus based on animal studies. Other contraceptive options would also be fine (although with birth control pills, you also need to use condoms for a month with each dose change, as the medication can interfere with the effectiveness of the pills).  We discussed limiting portions sizes (eating more frequently, if needed), be sure to get adequate protein intake and limit your carbs. Stay well hydrated.

## 2021-06-22 ENCOUNTER — Other Ambulatory Visit: Payer: Self-pay | Admitting: Family Medicine

## 2021-06-22 DIAGNOSIS — Z1231 Encounter for screening mammogram for malignant neoplasm of breast: Secondary | ICD-10-CM

## 2021-07-07 ENCOUNTER — Ambulatory Visit
Admission: RE | Admit: 2021-07-07 | Discharge: 2021-07-07 | Disposition: A | Discharge status: Home / Self Care | Payer: No Typology Code available for payment source | Source: Ambulatory Visit | Attending: Family Medicine | Admitting: Family Medicine

## 2021-07-07 ENCOUNTER — Other Ambulatory Visit: Payer: Self-pay

## 2021-07-07 DIAGNOSIS — Z1231 Encounter for screening mammogram for malignant neoplasm of breast: Secondary | ICD-10-CM

## 2021-07-15 NOTE — Progress Notes (Signed)
Chief Complaint  Patient presents with   other    F/u on weight loss no issues with medication     Patient presents for 4 week follow-up on weight loss, since starting Mounjaro. She had been given 4 weeks of 2.5mg  dose, given first dose in office on 7/21. Also sent in 5mg  rx, and she took that dose today.  She was able to get medication filled for $25.  She was counseled on the regular use of condoms, or consider alternative contraception. Discussed potential teratogenicity seen with animals. She reports using condoms regularly.  She notes very mild constipation, no other side effects. Having daily BMs, some days are firmer than others. No nausea, no hypoglycemia, no other side effects.  Weighing qod--helps her accountability. Not tracking diet regularly. She finds that she is not hungry in the morning, has coffee, doesn't eat until 11-12. Had diarrhea once after seasoned seafood.  Has been avoiding fried foods. No longer snacking much. Not as hungry, snacks only in small amounts. Eats until she is full, then stops.  Not exercising yet. Has a plan to walk laps after dropping daughter off to school (starts 8/29), before work.  PMH, PSH, SH reviewed  Outpatient Encounter Medications as of 07/16/2021  Medication Sig Note   cholecalciferol (VITAMIN D3) 25 MCG (1000 UNIT) tablet Take 1,000 Units by mouth daily.    Nutritional Supplements (ESTROVEN PO) Take 1 tablet by mouth daily.    tirzepatide [facility]) 5 MG/0.5ML Pen Inject 5 mg into the skin once a week.    tirzepatide [facility]) 2.5 MG/0.5ML Pen Inject 2.5 mg into the skin once a week for 28 days. (Patient not taking: Reported on 07/16/2021) 07/16/2021: finished   No facility-administered encounter medications on file as of 07/16/2021.   No Known Allergies  ROS: Denies fever, chills, URI symptoms, headaches, dizziness, shortness of breath, chest pain.  Denies nausea, vomiting, urinary complaints, bleeding, bruising,  rash. Decreased appetite and weight loss. See HPI   PHYSICAL EXAM:  BP 128/86   Pulse 68   Temp 98.2 F (36.8 C)   Wt (!) 316 lb 12.8 oz (143.7 kg)   LMP 07/13/2021   BMI 48.17 kg/m   Wt Readings from Last 3 Encounters:  07/16/21 (!) 316 lb 12.8 oz (143.7 kg)  06/18/21 (!) 325 lb 9.6 oz (147.7 kg)  06/11/21 (!) 326 lb 9.6 oz (148.1 kg)   BP Readings from Last 3 Encounters:  07/16/21 128/86  06/18/21 120/80  06/11/21 132/80   Well-appearing, pleasant female in no distress HEENT conjunctiva and sclera are clear, EOMI, wearing mask Neck: no lymphadenopathy or mass Heart: regular rate and rhythm Lungs: clear  Abdomen: soft, nontender Extremities: no edema Neuro: alert and oriented, normal strength, gait Psych: normal mood, affect, hygiene and grooming   ASSESSMENT/PLAN:  Class 3 severe obesity with serious comorbidity and body mass index (BMI) of 45.0 to 49.9 in adult, unspecified obesity type (HCC) - Good response to Mounjaro.  Titrate up to 5mg  weekly. Will continue this dose and f/u in 6 weeks  Reminded of need for continued use of condoms (and plan B, if needed). Encouraged regular exercise routine once daughter starts school. To consider counseling if notes more stress-eating, using food for coping.  F/u 6 weeks, and can determine if further titration of the dose is needed or not.

## 2021-07-16 ENCOUNTER — Other Ambulatory Visit: Payer: Self-pay

## 2021-07-16 ENCOUNTER — Encounter: Payer: Self-pay | Admitting: Family Medicine

## 2021-07-16 ENCOUNTER — Ambulatory Visit (INDEPENDENT_AMBULATORY_CARE_PROVIDER_SITE_OTHER): Payer: No Typology Code available for payment source | Admitting: Family Medicine

## 2021-07-16 VITALS — BP 128/86 | HR 68 | Temp 98.2°F | Wt 316.8 lb

## 2021-07-16 DIAGNOSIS — Z6841 Body Mass Index (BMI) 40.0 and over, adult: Secondary | ICD-10-CM

## 2021-07-17 ENCOUNTER — Encounter: Payer: 59 | Admitting: Family Medicine

## 2021-09-07 ENCOUNTER — Ambulatory Visit: Payer: No Typology Code available for payment source | Admitting: Family Medicine

## 2021-09-07 ENCOUNTER — Other Ambulatory Visit: Payer: Self-pay

## 2021-09-07 ENCOUNTER — Encounter: Payer: Self-pay | Admitting: Family Medicine

## 2021-09-07 DIAGNOSIS — Z23 Encounter for immunization: Secondary | ICD-10-CM

## 2021-09-07 DIAGNOSIS — R7303 Prediabetes: Secondary | ICD-10-CM

## 2021-09-07 DIAGNOSIS — Z79899 Other long term (current) drug therapy: Secondary | ICD-10-CM | POA: Diagnosis not present

## 2021-09-07 NOTE — Progress Notes (Signed)
   Subjective:    Patient ID: Lacey Gibson, female    DOB: 1976-04-07, 45 y.o.   MRN: [id number]  HPI Chief Complaint  Patient presents with   Obesity    F/U on new medication - after injection site will be itchy the next day and she will be constipated for the next 2 days. Has lost 10 lbs since starting medication   She is here to follow-up on weight and being on once weekly Mounjaro 2.5mg .  Reports she is doing well with the medication.  She has made healthy lifestyle changes and is now walking regularly.  States she has some mild constipation for the first 2 days after the injection but then feels fine afterwards.  She is eating as recommended even though she does not feel hungry.  She is not eating as much as usual.  Staying hydrated.  No other concerns today.  No fever, chills, dizziness, abdominal pain, vomiting or diarrhea.   Declines labs and states she recently had blood work with her insurance company and can send me the results.     Review of Systems Pertinent positives and negatives in the history of present illness.     Objective:   Physical Exam BP 138/90 (BP Location: Right Arm, Patient Position: Sitting)   Pulse 84   Ht 5\' 8"  (1.727 m)   Wt (!) 306 lb 3.2 oz (138.9 kg)   BMI 46.56 kg/m   Alert and oriented and in no acute distress.  Respirations unlabored.  Normal speech, mood      Assessment & Plan:  Morbid obesity (HCC)  Needs flu shot - Plan: Flu Vaccine QUAD 6+ mos PF IM (Fluarix Quad PF)  Prediabetes  Medication management   She is currently doing well on the current dose of Mounjaro.  She has lost 10 pounds over the past 7 weeks.  She has some mild symptoms for the first 2 days after her injection but then feels fine otherwise.  She has started walking for exercise.  She is pleased with her current results.   We will continue on the current dose at this time. Does not currently need refills.  Discussed follow-up here in the office with one  of the other providers in my absence when she does need refills and if she starts to plateau, she may consider the higher dose. Labs from 2021, no labs since.  She declines blood work today and will forward me the lab results from her insurance company lab visit.

## 2021-09-10 ENCOUNTER — Encounter: Payer: Self-pay | Admitting: Family Medicine

## 2021-09-14 ENCOUNTER — Encounter: Payer: Self-pay | Admitting: Family Medicine

## 2021-09-16 ENCOUNTER — Telehealth: Payer: Self-pay

## 2021-09-16 NOTE — Telephone Encounter (Signed)
Activated new discount card, called [facility] and helped pharmacist run discount card with rejection code 03 was able to get it to go thru for $25.  Pt informed

## 2021-09-17 NOTE — Telephone Encounter (Signed)
Activated new discount card, called [facility] and helped pharmacist run discount card with rejection code 03 was able to get it to go thru for $25.  Pt informed  

## 2021-10-09 ENCOUNTER — Ambulatory Visit (INDEPENDENT_AMBULATORY_CARE_PROVIDER_SITE_OTHER): Payer: No Typology Code available for payment source | Admitting: Medical

## 2021-10-09 ENCOUNTER — Other Ambulatory Visit: Payer: Self-pay

## 2021-10-09 VITALS — BP 120/70 | HR 100 | Wt 302.6 lb

## 2021-10-09 DIAGNOSIS — E785 Hyperlipidemia, unspecified: Secondary | ICD-10-CM

## 2021-10-09 DIAGNOSIS — R7303 Prediabetes: Secondary | ICD-10-CM | POA: Diagnosis not present

## 2021-10-09 DIAGNOSIS — E559 Vitamin D deficiency, unspecified: Secondary | ICD-10-CM | POA: Diagnosis not present

## 2021-10-09 DIAGNOSIS — E8881 Metabolic syndrome: Secondary | ICD-10-CM

## 2021-10-09 MED ORDER — VITAMIN D 25 MCG (1000 UNIT) PO TABS: 1000.0000 [IU] | ORAL_TABLET | Freq: Every day | ORAL | 3 refills | Status: DC

## 2021-10-09 MED ORDER — TIRZEPATIDE 7.5 MG/0.5ML ~~LOC~~ SOAJ: 7.5000 mg | SUBCUTANEOUS | 1 refills | Status: DC

## 2021-10-09 NOTE — Patient Instructions (Signed)
Options include: Saxenda Wegovey Mounjaro Ryblesus Ozempic Trulicity  Qsymia oral weight loss medication different than those above

## 2021-10-09 NOTE — Progress Notes (Signed)
Subjective:  Lacey Gibson is a 45 y.o. female who presents for Chief Complaint  Patient presents with   med check    Med check.     Here for med check.  She has a medical history signifncat for prediabetes, obesity, dyslipidemia, vitamin D deficiency.    She started [facility] 06/2021.    She had biometric labs at work 06/2021.    She denies any side effects on Mounjaro.  She is aware of addiotional contraception medication with dose changes with this medication.  Exercise - Walking in the park 4 days per week.    Dietary - coffee typically morning, eats sandwich typicaly at [employer], avoiding lots of carbs.  Medication is really helping apetite.  She notes about 20 lb weight loss since started [facility].  Sometimes not compliant with vitamin D supplement.   No other aggravating or relieving factors.    No other c/o.  Past Medical History:  Diagnosis Date   Anemia    Anxiety    Edema, lower extremity    GERD (gastroesophageal reflux disease)    Hyperlipidemia 11/23/2017   Prediabetes 11/23/2017   Sleep apnea    SOB (shortness of breath)    No current outpatient medications on file prior to visit.   No current facility-administered medications on file prior to visit.     The following portions of the patient's history were reviewed and updated as appropriate: allergies, current medications, past family history, past medical history, past social history, past surgical history and problem list.  ROS Otherwise as in subjective above  Objective: BP 120/70   Pulse 100   Wt (!) 302 lb 9.6 oz (137.3 kg)   BMI 46.01 kg/m   Wt Readings from Last 3 Encounters:  10/09/21 (!) 302 lb 9.6 oz (137.3 kg)  09/07/21 (!) 306 lb 3.2 oz (138.9 kg)  07/16/21 (!) 316 lb 12.8 oz (143.7 kg)   BP Readings from Last 3 Encounters:  10/09/21 120/70  09/07/21 138/90  07/16/21 128/86    General appearance: alert, no distress, well developed, well nourished Neck: supple, no  lymphadenopathy, no thyromegaly, no masses Heart: RRR, normal S1, S2, no murmurs Lungs: CTA bilaterally, no wheezes, rhonchi, or rales Pulses: 2+ radial pulses, 2+ pedal pulses, normal cap refill Ext: no edema   Assessment: Encounter Diagnoses  Name Primary?   Prediabetes Yes   Morbid obesity (HCC)    Vitamin D deficiency    Insulin resistance    Hyperlipidemia, unspecified hyperlipidemia type      Plan: I reviewed labs that she had done at work for biometric screening from August 2023.  Her lipids were actually improved from the year prior.  Her blood sugar was 90 fasting.  Since starting [place] already in August 2022 she has lost about 20 pounds according to her home scale, 14 pounds according to our scale.  She would like to increase the dose.  Dose increased today.  Discussed potential risk of the medication.  Discussed benefits and proper use of medication.  We discussed the need for alternate additional contraception over the next month  We discussed exercise strategies, diet, goals.  I recommend she follow-up in the near future for a fasting physical.  Glad to see she is seeing improvements on the medication.  [name] was seen today for med check.  Diagnoses and all orders for this visit:  Prediabetes  Morbid obesity (HCC)  Vitamin D deficiency  Insulin resistance  Hyperlipidemia, unspecified hyperlipidemia type  Other orders -  tirzepatide (MOUNJARO) 7.5 MG/0.5ML Pen; Inject 7.5 mg into the skin once a week. -     cholecalciferol (VITAMIN D3) 25 MCG (1000 UNIT) tablet; Take 1 tablet (1,000 Units total) by mouth daily.   Follow up: 6-8 weeks

## 2021-11-11 ENCOUNTER — Telehealth: Payer: Self-pay

## 2021-11-11 NOTE — Telephone Encounter (Signed)
Pt called that pharmacy having issue with discount card and Mounjaro.  Called pharmacy and they were able to get discount card to take off $500 max but still $567 left because pt's insurance not covering.  This is due to manufacture change in discount card

## 2021-11-12 NOTE — Telephone Encounter (Signed)
Called pt and informed, she will call insurance and see what weight loss medications are covered

## 2021-11-14 NOTE — Telephone Encounter (Signed)
P.A. MOUNJARO 

## 2021-11-24 NOTE — Telephone Encounter (Signed)
P.A. denied, only cover if pt has a diagnosis of type 2 diabetes mellitus with A1C greater than or equal to 6.5%,

## 2021-11-25 ENCOUNTER — Other Ambulatory Visit: Payer: Self-pay | Admitting: Medical

## 2021-11-25 MED ORDER — PHENTERMINE HCL 37.5 MG PO TABS: 37.5000 mg | ORAL_TABLET | Freq: Every day | ORAL | 0 refills | Status: DC

## 2021-12-01 NOTE — Telephone Encounter (Signed)
Called pt and she is going with Phentermine, Lacey Gibson called in today

## 2022-03-09 NOTE — Progress Notes (Signed)
? ?Complete physical exam ? ? ?Patient: Lacey Gibson   DOB: Dec 28, 1975   46 y.o. Female  MRN: [id number] ?Visit Date: 03/10/2022 ? ?Chief Complaint  ?Patient presents with  ? Annual Exam  ?  Fasting CPE- wants weight loss options  ? ?Subjective  ?  ?Lacey Gibson is a 46 y.o. female who presents today for a complete physical exam.  ? ?Reports is generally feeling well; is eating a regular diet; is sleeping about 6 - 7  hours ; drinks about 60+ ounces of water a day; is exercising by walking 30 miutes, 4 - 5 days a week; asks about other weight loss options since her insurance doesn't pay for Mounjaro injections; states she has already been to [facility] and it was too expensive. Also reports that she is starting to have menopause symptoms and states they are manageable. ? ? ?HPI ?HPI   ? ? Annual Exam   ? Additional comments: Fasting CPE- wants weight loss options ? ?  ?  ?Last edited by Deforest Hoyles, [place] on 03/10/2022  8:20 AM.  ?  ?  ? ? ?Past Medical History:  ?Diagnosis Date  ? Anemia   ? Anxiety   ? Edema, lower extremity   ? GERD (gastroesophageal reflux disease)   ? Hyperlipidemia 11/23/2017  ? Morbid obesity ([place]) 12/18/2018  ? Prediabetes 11/23/2017  ? Sleep apnea   ? SOB (shortness of breath)   ? ?History reviewed. No pertinent surgical history. ?Social History  ? ?Socioeconomic History  ? Marital status: Married  ?  Spouse name: Legrand Como  ? Number of children: Not on file  ? Years of education: Not on file  ? Highest education level: Not on file  ?Occupational History  ? Occupation: [employer]  ?Tobacco Use  ? Smoking status: Never  ? Smokeless tobacco: Never  ?Vaping Use  ? Vaping Use: Never used  ?Substance and Sexual Activity  ? Alcohol use: Yes  ?  Comment: 0-1  ? Drug use: No  ? Sexual activity: Yes  ?  Birth control/protection: None  ?Other Topics Concern  ? Not on file  ?Social History Narrative  ? Not on file  ? ?Social Determinants of Health  ? ?Financial  Resource Strain: Not on file  ?Food Insecurity: Not on file  ?Transportation Needs: Not on file  ?Physical Activity: Not on file  ?Stress: Not on file  ?Social Connections: Not on file  ?Intimate Partner Violence: Not on file  ? ?Family Status  ?Relation Name Status  ? Mother  Alive  ? Father  Deceased  ? Daughter  Alive  ? MGM  Deceased  ? Neg Hx  (Not Specified)  ? ?Family History  ?Problem Relation Age of Onset  ? Diabetes Mother   ? Hypertension Mother   ? Sleep apnea Mother   ? Obesity Mother   ? Alzheimer's disease Father 58  ? Heart disease Father   ? Healthy Daughter   ? Asthma Daughter   ? Diabetes Maternal Grandmother   ? Breast cancer Neg Hx   ? ?No Known Allergies  ?Patient Care Team: ?Marcellina Millin as PCP - General (Physician Assistant)  ? ?Medications: ?Outpatient Medications Prior to Visit  ?Medication Sig  ? [DISCONTINUED] cholecalciferol (VITAMIN D3) 25 MCG (1000 UNIT) tablet Take 1 tablet (1,000 Units total) by mouth daily. (Patient not taking: Reported on 03/10/2022)  ? [DISCONTINUED] phentermine (ADIPEX-P) 37.5 MG tablet Take 1 tablet (37.5 mg total)  by mouth daily before breakfast. (Patient not taking: Reported on 03/10/2022)  ? [DISCONTINUED] tirzepatide (MOUNJARO) 7.5 MG/0.5ML Pen Inject 7.5 mg into the skin once a week. (Patient not taking: Reported on 03/10/2022)  ? ?No facility-administered medications prior to visit.  ? ? ?Review of Systems  ?Constitutional:  Negative for activity change and fever.  ?HENT:  Negative for congestion, ear pain and voice change.   ?Eyes:  Negative for redness.  ?Respiratory:  Negative for cough.   ?Cardiovascular:  Negative for chest pain.  ?Gastrointestinal:  Negative for constipation and diarrhea.  ?Endocrine: Negative for polyuria.  ?Genitourinary:  Negative for flank pain.  ?Musculoskeletal:  Negative for gait problem and neck stiffness.  ?Skin:  Negative for color change and rash.  ?Neurological:  Negative for dizziness.  ?Hematological:  Negative  for adenopathy.  ?Psychiatric/Behavioral:  Negative for agitation, behavioral problems and confusion.   ? ?Last CBC ?Lab Results  ?Component Value Date  ? WBC 7.7 12/18/2018  ? HGB 11.2 12/18/2018  ? HCT 34.9 12/18/2018  ? MCV 70 (L) 12/18/2018  ? MCH 22.5 (L) 12/18/2018  ? RDW 16.4 (H) 12/18/2018  ? PLT 346 12/18/2018  ? ?Last metabolic panel ?Lab Results  ?Component Value Date  ? GLUCOSE 88 08/06/2020  ? NA 139 08/06/2020  ? K 5.0 08/06/2020  ? CL 105 08/06/2020  ? CO2 22 08/06/2020  ? BUN 8 08/06/2020  ? CREATININE 0.95 08/06/2020  ? GFRNONAA 74 08/06/2020  ? CALCIUM 9.2 08/06/2020  ? PROT 7.4 08/06/2020  ? ALBUMIN 3.8 08/06/2020  ? LABGLOB 3.6 08/06/2020  ? AGRATIO 1.1 (L) 08/06/2020  ? BILITOT 0.3 08/06/2020  ? ALKPHOS 72 08/06/2020  ? AST 20 08/06/2020  ? ALT 17 08/06/2020  ? ?Last lipids ?Lab Results  ?Component Value Date  ? CHOL 205 (H) 08/06/2020  ? HDL 67 08/06/2020  ? LDLCALC 122 (H) 08/06/2020  ? TRIG 87 08/06/2020  ? CHOLHDL 3.0 11/17/2017  ? ?Last hemoglobin A1c ?Lab Results  ?Component Value Date  ? HGBA1C 5.4 08/06/2020  ? ?Last thyroid functions ?Lab Results  ?Component Value Date  ? TSH 3.000 08/06/2020  ? T3TOTAL 125 08/06/2020  ? ?  ? ?The 10-year ASCVD risk score (Arnett DK, et al., 2019) is: 0.7% ? ? Objective  ?  ?BP 130/70   Pulse 62   Ht 5\' 8"  (1.727 m)   Wt (!) 308 lb 6.4 oz (139.9 kg)   LMP 03/06/2022   BMI 46.89 kg/m?  ? ?  ? ? ?Physical Exam ?Vitals and nursing note reviewed.  ?Constitutional:   ?   General: She is not in acute distress. ?   Appearance: Normal appearance. She is obese. She is not ill-appearing.  ?HENT:  ?   Head: Normocephalic and atraumatic.  ?   Right Ear: Tympanic membrane, ear canal and external ear normal.  ?   Left Ear: Tympanic membrane, ear canal and external ear normal.  ?   Nose: No congestion.  ?Eyes:  ?   Extraocular Movements: Extraocular movements intact.  ?   Conjunctiva/sclera: Conjunctivae normal.  ?   Pupils: Pupils are equal, round, and reactive  to light.  ?Neck:  ?   Vascular: No carotid bruit.  ?Cardiovascular:  ?   Rate and Rhythm: Normal rate and regular rhythm.  ?   Pulses: Normal pulses.  ?   Heart sounds: Normal heart sounds.  ?Pulmonary:  ?   Effort: Pulmonary effort is normal.  ?   Breath sounds:  Normal breath sounds. No wheezing.  ?Abdominal:  ?   General: Bowel sounds are normal.  ?   Palpations: Abdomen is soft.  ?Musculoskeletal:     ?   General: Normal range of motion.  ?   Cervical back: Normal range of motion and neck supple.  ?   Right lower leg: No edema.  ?   Left lower leg: No edema.  ?Skin: ?   General: Skin is warm and dry.  ?   Findings: No bruising.  ?Neurological:  ?   General: No focal deficit present.  ?   Mental Status: She is alert and oriented to person, place, and time.  ?Psychiatric:     ?   Mood and Affect: Mood normal.     ?   Behavior: Behavior normal.     ?   Thought Content: Thought content normal.  ?  ? ? ?Last depression screening scores ? ?  03/10/2022  ?  8:21 AM 08/06/2020  ?  8:54 AM 07/16/2020  ?  1:28 PM  ?PHQ 2/9 Scores  ?PHQ - 2 Score 0 4 0  ?PHQ- 9 Score  16   ? ?Last fall risk screening ? ?  03/10/2022  ?  8:20 AM  ?Fall Risk   ?Falls in the past year? 0  ?Number falls in past yr: 0  ?Injury with Fall? 0  ?Risk for fall due to : No Fall Risks  ?Follow up Falls evaluation completed  ? ? ? ?No results found for any visits on 03/10/22. ? Assessment & Plan  ?  ?Routine Health Maintenance and Physical Exam ? ?Exercise Activities and Dietary recommendations ? Goals   ?None ?  ? ? ?Immunization History  ?Administered Date(s) Administered  ? Influenza,inj,Quad PF,6+ Mos 09/12/2014, 09/13/2018, 08/30/2019, 09/07/2021  ? Influenza-Unspecified 09/29/2017, 09/12/2020  ? PFIZER(Purple Top)SARS-COV-2 Vaccination 03/08/2020, 04/01/2020, 11/09/2020  ? Tdap 07/16/2020  ? ? ?Health Maintenance  ?Topic Date Due  ? COVID-19 Vaccine (4 - Booster for [place] series) 10/18/2022 (Originally 01/04/2021)  ? Hepatitis C Screening  03/11/2023  (Originally 08/24/1995)  ? HIV Screening  03/11/2023 (Originally 08/23/1992)  ? INFLUENZA VACCINE  06/29/2022  ? PAP SMEAR-Modifier  10/14/2022  ? TETANUS/TDAP  07/16/2030  ? HPV VACCINES  Aged Out  ? ? ?Dis

## 2022-03-10 ENCOUNTER — Encounter: Payer: Self-pay | Admitting: Physician Assistant

## 2022-03-10 ENCOUNTER — Ambulatory Visit (INDEPENDENT_AMBULATORY_CARE_PROVIDER_SITE_OTHER): Payer: No Typology Code available for payment source | Admitting: Physician Assistant

## 2022-03-10 VITALS — BP 130/70 | HR 62 | Ht 68.0 in | Wt 308.4 lb

## 2022-03-10 DIAGNOSIS — Z0000 Encounter for general adult medical examination without abnormal findings: Secondary | ICD-10-CM

## 2022-03-10 DIAGNOSIS — N951 Menopausal and female climacteric states: Secondary | ICD-10-CM

## 2022-03-10 DIAGNOSIS — E782 Mixed hyperlipidemia: Secondary | ICD-10-CM | POA: Diagnosis not present

## 2022-03-10 DIAGNOSIS — R7303 Prediabetes: Secondary | ICD-10-CM

## 2022-03-10 DIAGNOSIS — Z6841 Body Mass Index (BMI) 40.0 and over, adult: Secondary | ICD-10-CM

## 2022-03-10 DIAGNOSIS — E66813 Obesity, class 3: Secondary | ICD-10-CM | POA: Insufficient documentation

## 2022-03-10 NOTE — Assessment & Plan Note (Addendum)
Will moitor, drink 8 - 10 glasses of water daily, limit sugar intake and eat a low fat diet, exercise 3 - 5 days a week, example walking 1 - 2 miles, can try OTC Weight Loss Plans ? ?

## 2022-03-10 NOTE — Assessment & Plan Note (Signed)
controlled, eat a low fat diet, increase fiber intake (Benefiber or Metamucil, Cherrios,  oatmeal, beans, nuts, fruits and vegetables), limit saturated fats (in fried foods, red meat), can add OTC fish oil supplement, eat fish with Omega-3 fatty acids like salmon and tuna, exercise for 30 minutes 3 - 5 times a week, drink 8 - 10 glasses of water a day ? ? ?

## 2022-03-10 NOTE — Assessment & Plan Note (Signed)
Stable, perimenopause advice given, can follow up with OB-Gyn prn ?

## 2022-03-10 NOTE — Patient Instructions (Signed)

## 2022-03-10 NOTE — Assessment & Plan Note (Signed)
controlled, eat a low sugar diet, avoid starchy food with a lot of carbohydrates, avoid fried and processed foods; last hgb a1c 5.4 on 08/07/2020 ? ?

## 2022-03-11 ENCOUNTER — Other Ambulatory Visit: Payer: Self-pay | Admitting: Physician Assistant

## 2022-03-11 DIAGNOSIS — D509 Iron deficiency anemia, unspecified: Secondary | ICD-10-CM

## 2022-03-11 LAB — LIPID PANEL
Chol/HDL Ratio: 2.9 ratio (ref 0.0–4.4)
Cholesterol, Total: 195 mg/dL (ref 100–199)
HDL: 68 mg/dL (ref >39)
LDL Chol Calc (NIH): 114 mg/dL — ABNORMAL HIGH (ref 0–99)
Triglycerides: 71 mg/dL (ref 0–149)
VLDL Cholesterol Cal: 13 mg/dL (ref 5–40)

## 2022-03-11 LAB — COMPREHENSIVE METABOLIC PANEL
ALT: 15 IU/L (ref 0–32)
AST: 19 IU/L (ref 0–40)
Albumin/Globulin Ratio: 1.4 (ref 1.2–2.2)
Albumin: 3.9 g/dL (ref 3.8–4.8)
Alkaline Phosphatase: 67 IU/L (ref 44–121)
BUN/Creatinine Ratio: 9 (ref 9–23)
BUN: 9 mg/dL (ref 6–24)
Bilirubin Total: 0.3 mg/dL (ref 0.0–1.2)
CO2: 23 mmol/L (ref 20–29)
Calcium: 8.9 mg/dL (ref 8.7–10.2)
Chloride: 108 mmol/L — ABNORMAL HIGH (ref 96–106)
Creatinine, Ser: 1.01 mg/dL — ABNORMAL HIGH (ref 0.57–1.00)
Globulin, Total: 2.7 g/dL (ref 1.5–4.5)
Glucose: 97 mg/dL (ref 70–99)
Potassium: 4.7 mmol/L (ref 3.5–5.2)
Sodium: 141 mmol/L (ref 134–144)
Total Protein: 6.6 g/dL (ref 6.0–8.5)
eGFR: 70 mL/min/{1.73_m2} (ref >59)

## 2022-03-11 LAB — CBC WITH DIFFERENTIAL/PLATELET
Basophils Absolute: 0.1 10*3/uL (ref 0.0–0.2)
Basos: 1 %
EOS (ABSOLUTE): 0.5 10*3/uL — ABNORMAL HIGH (ref 0.0–0.4)
Eos: 9 %
Hematocrit: 35.2 % (ref 34.0–46.6)
Hemoglobin: 10.3 g/dL — ABNORMAL LOW (ref 11.1–15.9)
Immature Grans (Abs): 0 10*3/uL (ref 0.0–0.1)
Immature Granulocytes: 0 %
Lymphocytes Absolute: 2.1 10*3/uL (ref 0.7–3.1)
Lymphs: 36 %
MCH: 20.6 pg — ABNORMAL LOW (ref 26.6–33.0)
MCHC: 29.3 g/dL — ABNORMAL LOW (ref 31.5–35.7)
MCV: 70 fL — ABNORMAL LOW (ref 79–97)
Monocytes Absolute: 0.5 10*3/uL (ref 0.1–0.9)
Monocytes: 9 %
Neutrophils Absolute: 2.6 10*3/uL (ref 1.4–7.0)
Neutrophils: 45 %
Platelets: 337 10*3/uL (ref 150–450)
RBC: 5.01 x10E6/uL (ref 3.77–5.28)
RDW: 19.1 % — ABNORMAL HIGH (ref 11.7–15.4)
WBC: 5.7 10*3/uL (ref 3.4–10.8)

## 2022-03-11 LAB — HEMOGLOBIN A1C
Est. average glucose Bld gHb Est-mCnc: 120 mg/dL
Hgb A1c MFr Bld: 5.8 % — ABNORMAL HIGH (ref 4.8–5.6)

## 2022-03-11 MED ORDER — FUSION PLUS PO CAPS: 1.0000 | ORAL_CAPSULE | Freq: Every day | ORAL | 11 refills | Status: DC

## 2022-03-15 ENCOUNTER — Encounter: Payer: Self-pay | Admitting: Physician Assistant

## 2022-05-28 ENCOUNTER — Encounter: Payer: Self-pay | Admitting: Internal Medicine

## 2022-07-06 ENCOUNTER — Encounter: Payer: Self-pay | Admitting: Family Medicine

## 2022-07-06 ENCOUNTER — Other Ambulatory Visit: Payer: Self-pay | Admitting: Family Medicine

## 2022-07-06 DIAGNOSIS — Z1231 Encounter for screening mammogram for malignant neoplasm of breast: Secondary | ICD-10-CM

## 2022-07-07 ENCOUNTER — Encounter (INDEPENDENT_AMBULATORY_CARE_PROVIDER_SITE_OTHER): Payer: Self-pay

## 2022-07-26 ENCOUNTER — Ambulatory Visit
Admission: RE | Admit: 2022-07-26 | Discharge: 2022-07-26 | Disposition: A | Discharge status: Home / Self Care | Payer: No Typology Code available for payment source | Source: Ambulatory Visit | Attending: Family Medicine | Admitting: Family Medicine

## 2022-07-26 DIAGNOSIS — Z1231 Encounter for screening mammogram for malignant neoplasm of breast: Secondary | ICD-10-CM

## 2022-08-04 ENCOUNTER — Encounter: Payer: Self-pay | Admitting: Internal Medicine

## 2022-09-07 ENCOUNTER — Encounter: Payer: Self-pay | Admitting: Internal Medicine

## 2022-09-09 ENCOUNTER — Ambulatory Visit (INDEPENDENT_AMBULATORY_CARE_PROVIDER_SITE_OTHER): Payer: No Typology Code available for payment source | Admitting: Obstetrics and Gynecology

## 2022-09-09 ENCOUNTER — Other Ambulatory Visit (HOSPITAL_COMMUNITY)
Admission: RE | Admit: 2022-09-09 | Discharge: 2022-09-09 | Disposition: A | Discharge status: Home / Self Care | Payer: No Typology Code available for payment source | Source: Ambulatory Visit | Attending: Obstetrics and Gynecology | Admitting: Obstetrics and Gynecology

## 2022-09-09 ENCOUNTER — Encounter: Payer: Self-pay | Admitting: Obstetrics and Gynecology

## 2022-09-09 VITALS — BP 129/84 | HR 60 | Ht 68.0 in | Wt 316.0 lb

## 2022-09-09 DIAGNOSIS — Z01419 Encounter for gynecological examination (general) (routine) without abnormal findings: Secondary | ICD-10-CM | POA: Insufficient documentation

## 2022-09-09 DIAGNOSIS — N939 Abnormal uterine and vaginal bleeding, unspecified: Secondary | ICD-10-CM

## 2022-09-09 DIAGNOSIS — Z6841 Body Mass Index (BMI) 40.0 and over, adult: Secondary | ICD-10-CM

## 2022-09-09 LAB — POCT URINE PREGNANCY: Preg Test, Ur: NEGATIVE

## 2022-09-09 NOTE — Progress Notes (Signed)
Subjective:     Lacey Gibson is a 46 y.o. female P1 with BMI 48 and LMP 07/12/22 who here for a comprehensive physical exam. The patient reports irregular cycles. She reports amenorrhea in May and June followed by a heavy period in July and August. She has been amenorrheic since August. She is sexually active without contraception. She denies pelvic pain or abnormal discharge. She denies urinary incontinence or constipation.   Past Medical History:  Diagnosis Date   Anemia    Anxiety    Edema, lower extremity    GERD (gastroesophageal reflux disease)    Hyperlipidemia 11/23/2017   Morbid obesity ([place]) 12/18/2018   Prediabetes 11/23/2017   Sleep apnea    SOB (shortness of breath)    History reviewed. No pertinent surgical history. Family History  Problem Relation Age of Onset   Diabetes Mother    Hypertension Mother    Sleep apnea Mother    Obesity Mother    Alzheimer's disease Father 50   Heart disease Father    Healthy Daughter    Asthma Daughter    Diabetes Maternal Grandmother    Breast cancer Neg Hx      Social History   Socioeconomic History   Marital status: Married    Spouse name: Legrand Como   Number of children: Not on file   Years of education: Not on file   Highest education level: Not on file  Occupational History   Occupation: Compliance Analyst [employer]  Tobacco Use   Smoking status: Never   Smokeless tobacco: Never  Vaping Use   Vaping Use: Never used  Substance and Sexual Activity   Alcohol use: Yes    Comment: 0-1   Drug use: No   Sexual activity: Yes    Birth control/protection: None  Other Topics Concern   Not on file  Social History Narrative   Not on file   Social Determinants of Health   Financial Resource Strain: Not on file  Food Insecurity: Not on file  Transportation Needs: Not on file  Physical Activity: Not on file  Stress: Not on file  Social Connections: Not on file  Intimate Partner Violence: Not on file   Health  Maintenance  Topic Date Due   INFLUENZA VACCINE  06/29/2022   COLONOSCOPY (Pts 45-85yrs Insurance coverage will need to be confirmed)  Never done   PAP SMEAR-Modifier  10/14/2022   COVID-19 Vaccine (4 - Pfizer risk series) 10/18/2022 (Originally 01/04/2021)   Hepatitis C Screening  03/11/2023 (Originally 08/24/1995)   HIV Screening  03/11/2023 (Originally 08/23/1992)   TETANUS/TDAP  07/16/2030   HPV VACCINES  Aged Out       Review of Systems Pertinent items noted in HPI and remainder of comprehensive ROS otherwise negative.   Objective:  Blood pressure 129/84, pulse 60, height 5\' 8"  (1.727 m), weight (!) 316 lb (143.3 kg), last menstrual period 07/12/2022.  GENERAL: Well-developed, well-nourished female in no acute distress.  HEENT: Normocephalic, atraumatic. Sclerae anicteric.  NECK: Supple. Normal thyroid.  LUNGS: Clear to auscultation bilaterally.  HEART: Regular rate and rhythm. BREASTS: Symmetric in size. No palpable masses or lymphadenopathy, skin changes, or nipple drainage. ABDOMEN: Soft, nontender, nondistended. No organomegaly. PELVIC: Normal external female genitalia. Vagina is pink and rugated.  Normal discharge. Normal appearing cervix. Bimanual exam limited secondary to body habitus. No adnexal mass or tenderness. Chaperone present during the pelvic exam EXTREMITIES: No cyanosis, clubbing, or edema, 2+ distal pulses.    Office UPT negative Assessment:  Healthy female exam.      Plan:    Pap smear collected Patient current on screening mammogram Pelvic ultrasound ordered Patient referred to GI for colonoscopy Discussed weight loss management to help improve recent pre-diabetes range A1C. Patient referred to nutrionist Patient will be contacted with abnormal results See After Visit Summary for Counseling Recommendations

## 2022-09-09 NOTE — Progress Notes (Signed)
Pt states she is having issues with cycle as before.  Pt states cycles will last for a long time (weeks) and then not have a cycle for a while. Pt may also have spotting between. Pt has no form of BC.

## 2022-09-15 LAB — CYTOLOGY - PAP
Adequacy: ABSENT
Diagnosis: NEGATIVE

## 2022-09-16 ENCOUNTER — Ambulatory Visit (HOSPITAL_BASED_OUTPATIENT_CLINIC_OR_DEPARTMENT_OTHER): Payer: No Typology Code available for payment source

## 2022-10-04 ENCOUNTER — Other Ambulatory Visit (INDEPENDENT_AMBULATORY_CARE_PROVIDER_SITE_OTHER): Payer: No Typology Code available for payment source

## 2022-10-04 DIAGNOSIS — Z23 Encounter for immunization: Secondary | ICD-10-CM

## 2022-10-12 ENCOUNTER — Encounter: Payer: Self-pay | Admitting: Internal Medicine

## 2022-11-30 ENCOUNTER — Ambulatory Visit (HOSPITAL_BASED_OUTPATIENT_CLINIC_OR_DEPARTMENT_OTHER): Payer: No Typology Code available for payment source

## 2023-03-15 ENCOUNTER — Encounter: Payer: No Typology Code available for payment source | Admitting: Physician Assistant

## 2023-04-19 IMAGING — MG DIGITAL SCREENING BILAT W/ CAD
4 series · 4 of 4 positions shown · non-contrast
Comparison: Previous exam(s).

CLINICAL DATA: Screening.

EXAM:
DIGITAL SCREENING BILATERAL MAMMOGRAM WITH CAD
TECHNIQUE: Bilateral screening digital craniocaudal and mediolateral oblique
mammograms were obtained. The images were evaluated with
computer-aided detection.

[L MLO (1 of 2)]
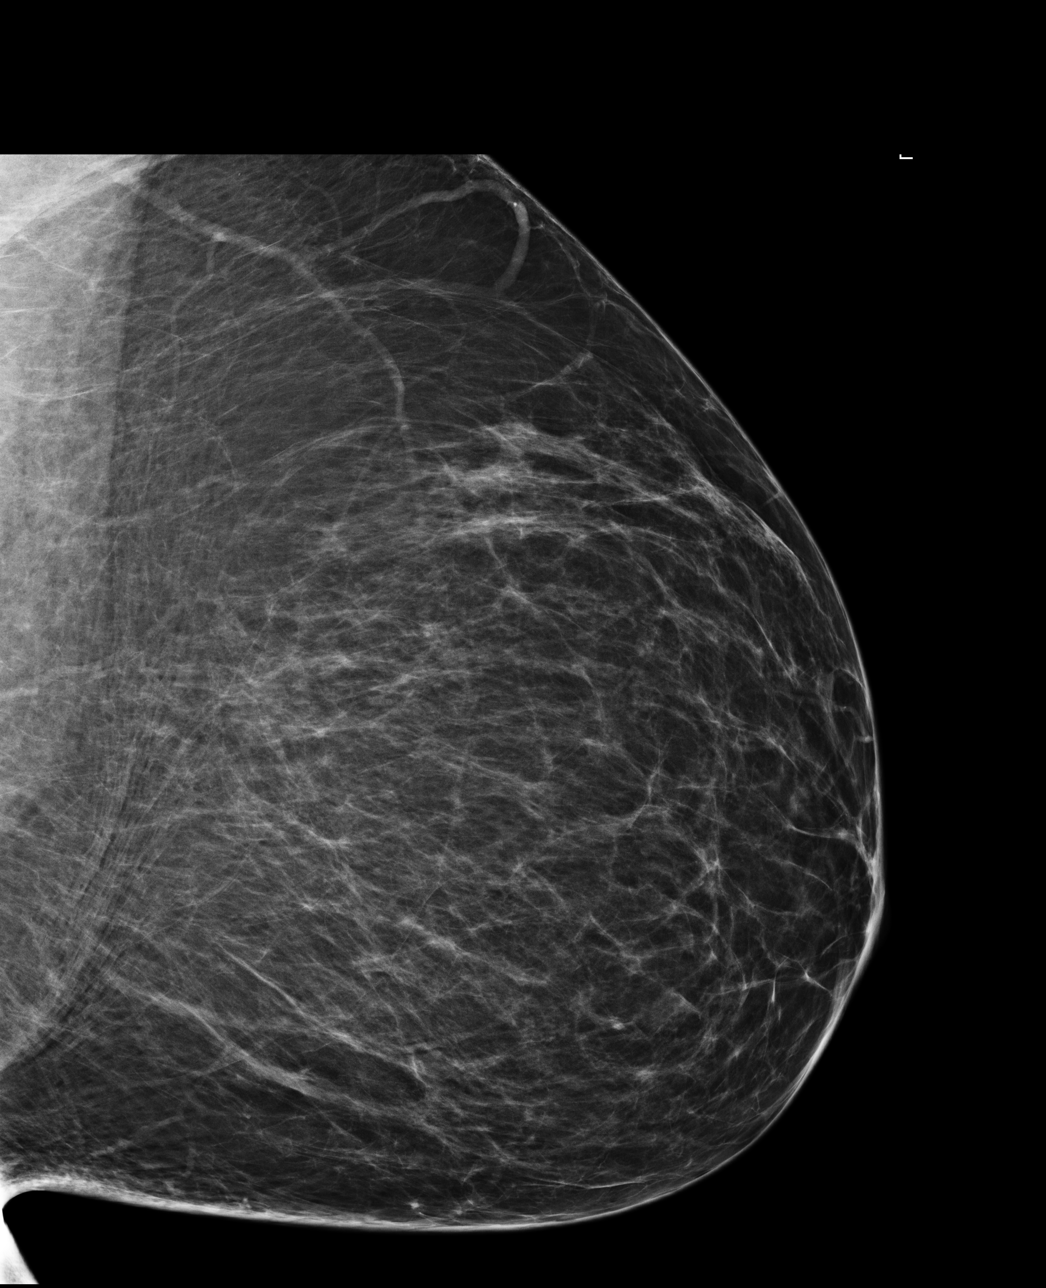

[L CC]
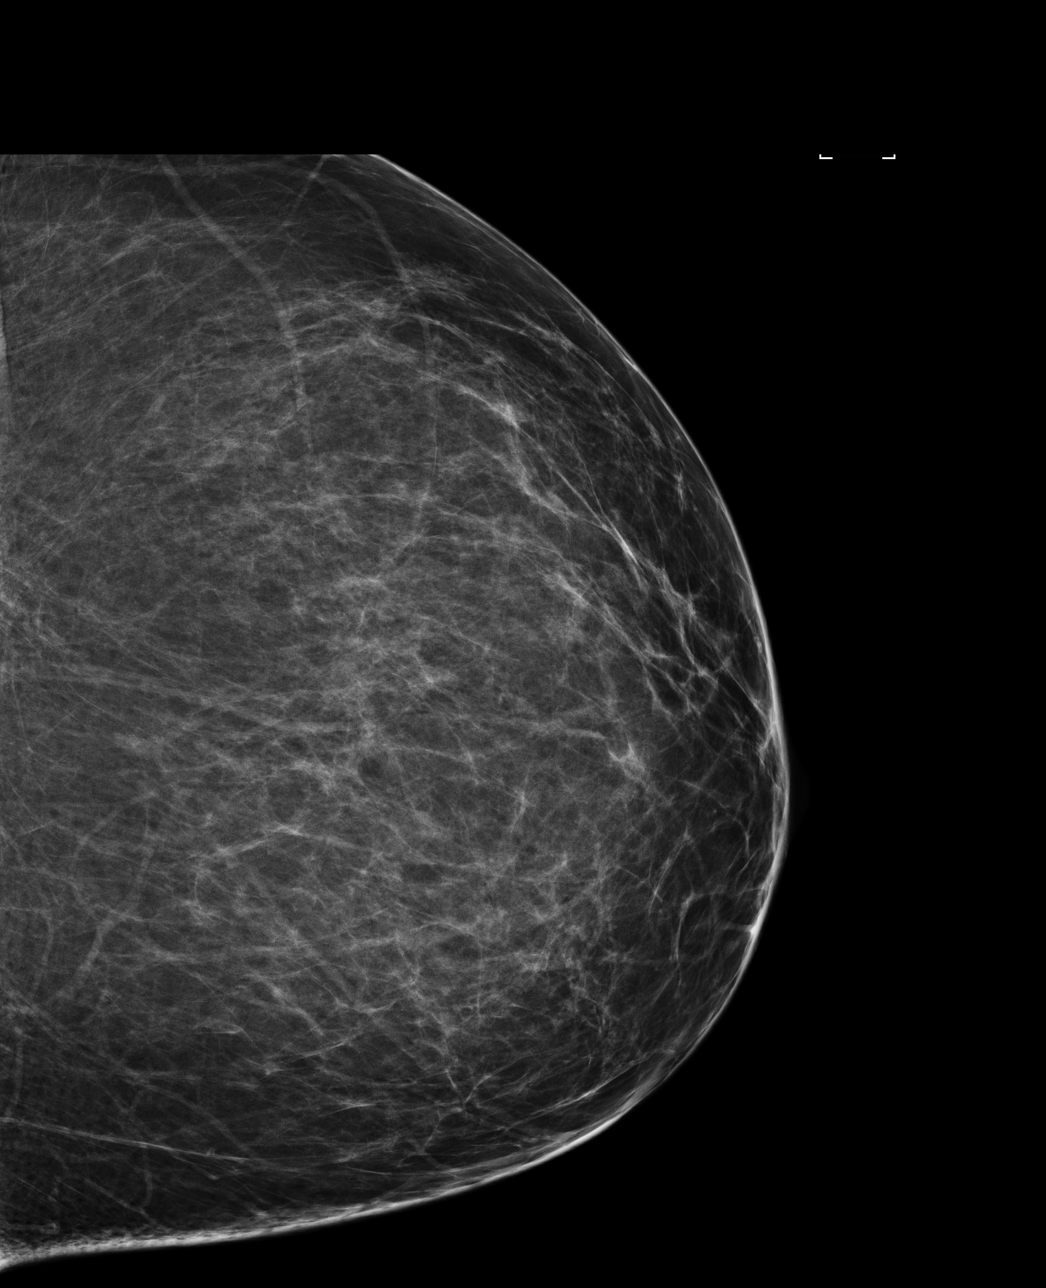

[L MLO (2 of 2)]
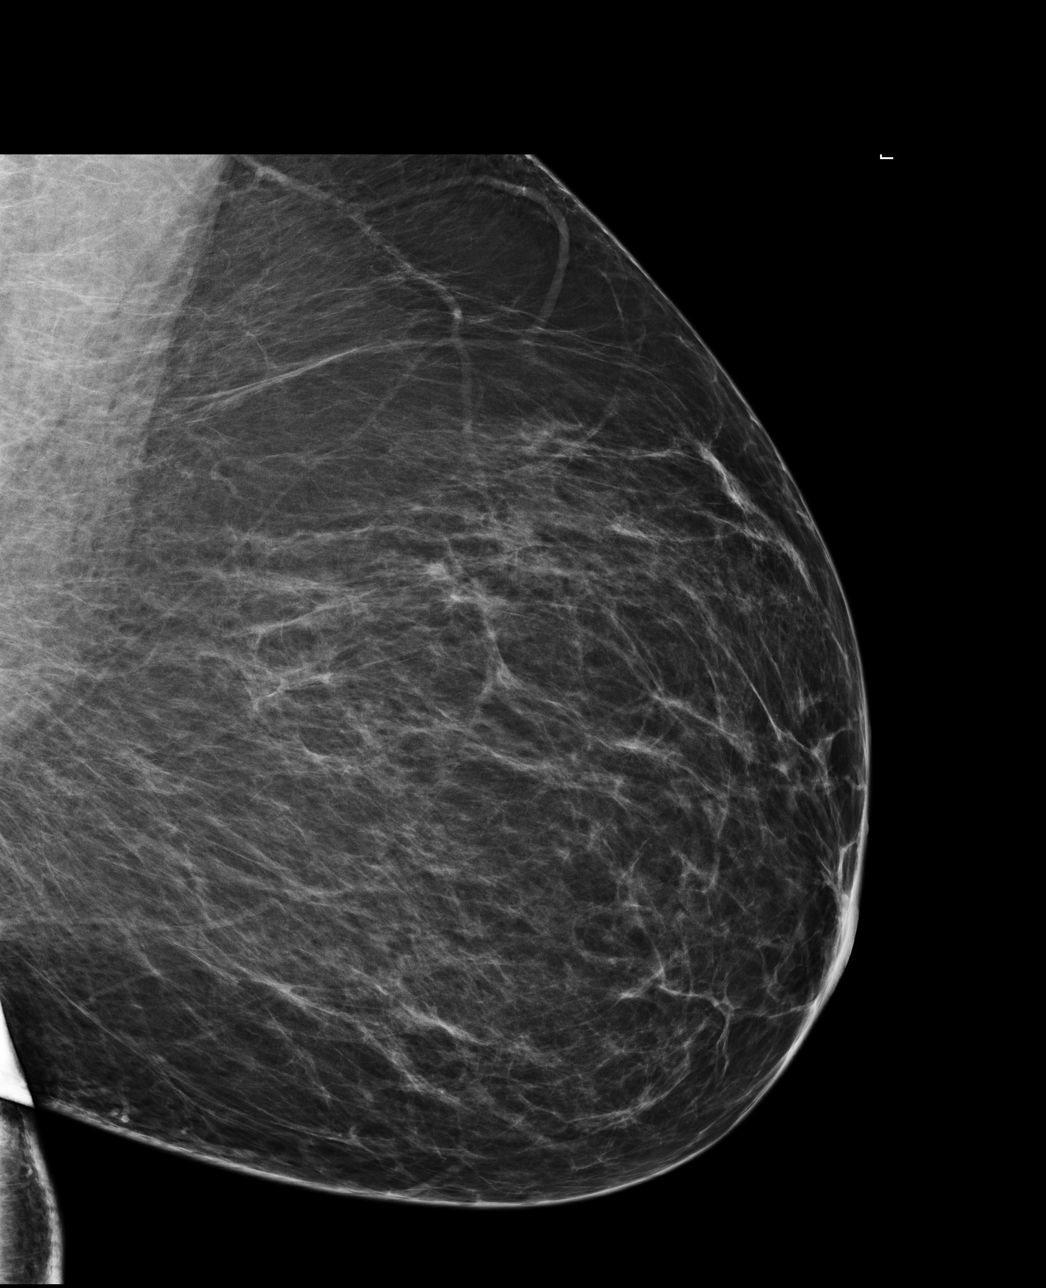

[R CC]
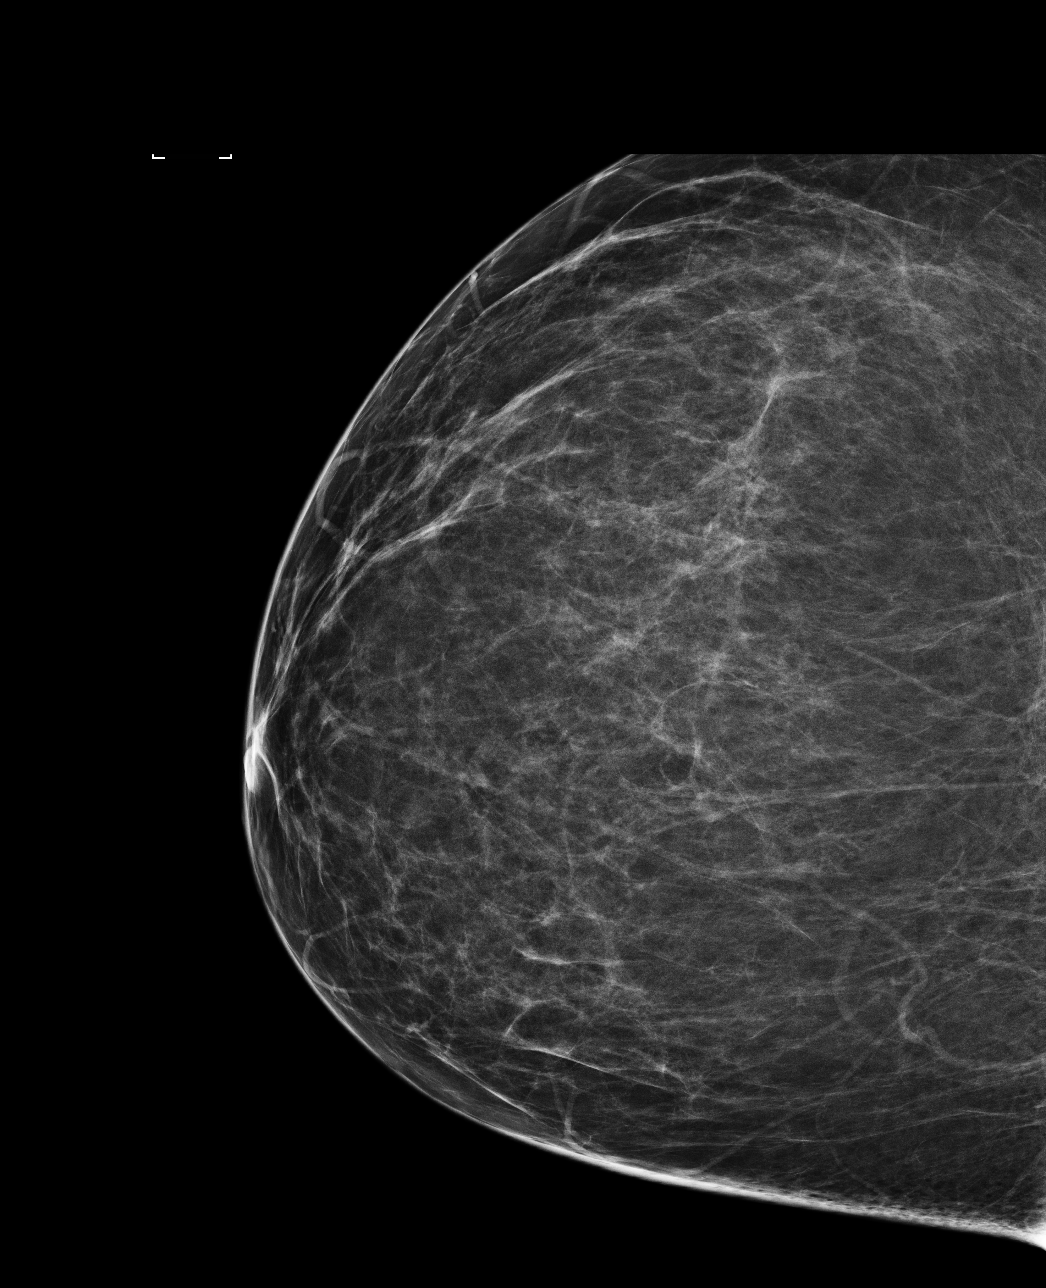

[4 of 4 positions shown; findings below may reference images not displayed]

ACR Breast Density Category b: There are scattered areas of
fibroglandular density.
FINDINGS: There are no findings suspicious for malignancy.
IMPRESSION: No mammographic evidence of malignancy. A result letter of this
screening mammogram will be mailed directly to the patient.

RECOMMENDATION:
Screening mammogram in one year. (Code:WO-V-ZRK)

BI-RADS CATEGORY  1: Negative.

## 2023-06-22 ENCOUNTER — Other Ambulatory Visit: Payer: Self-pay | Admitting: Nurse Practitioner

## 2023-06-22 DIAGNOSIS — Z1231 Encounter for screening mammogram for malignant neoplasm of breast: Secondary | ICD-10-CM

## 2023-07-29 ENCOUNTER — Ambulatory Visit
Admission: RE | Admit: 2023-07-29 | Discharge: 2023-07-29 | Disposition: A | Discharge status: Home / Self Care | Payer: No Typology Code available for payment source | Source: Ambulatory Visit | Attending: Nurse Practitioner

## 2023-07-29 ENCOUNTER — Ambulatory Visit: Payer: No Typology Code available for payment source

## 2023-07-29 DIAGNOSIS — Z1231 Encounter for screening mammogram for malignant neoplasm of breast: Secondary | ICD-10-CM

## 2023-08-30 ENCOUNTER — Encounter: Payer: Self-pay | Admitting: Nurse Practitioner

## 2023-08-30 ENCOUNTER — Ambulatory Visit: Payer: No Typology Code available for payment source | Admitting: Nurse Practitioner

## 2023-08-30 VITALS — BP 124/80 | HR 64 | Ht 68.0 in | Wt 335.0 lb

## 2023-08-30 DIAGNOSIS — N951 Menopausal and female climacteric states: Secondary | ICD-10-CM

## 2023-08-30 DIAGNOSIS — Z114 Encounter for screening for human immunodeficiency virus [HIV]: Secondary | ICD-10-CM

## 2023-08-30 DIAGNOSIS — Z0000 Encounter for general adult medical examination without abnormal findings: Secondary | ICD-10-CM

## 2023-08-30 DIAGNOSIS — Z23 Encounter for immunization: Secondary | ICD-10-CM | POA: Diagnosis not present

## 2023-08-30 DIAGNOSIS — Z1211 Encounter for screening for malignant neoplasm of colon: Secondary | ICD-10-CM

## 2023-08-30 DIAGNOSIS — E66813 Obesity, class 3: Secondary | ICD-10-CM

## 2023-08-30 DIAGNOSIS — R0609 Other forms of dyspnea: Secondary | ICD-10-CM

## 2023-08-30 DIAGNOSIS — R319 Hematuria, unspecified: Secondary | ICD-10-CM

## 2023-08-30 DIAGNOSIS — E559 Vitamin D deficiency, unspecified: Secondary | ICD-10-CM

## 2023-08-30 DIAGNOSIS — Z1159 Encounter for screening for other viral diseases: Secondary | ICD-10-CM

## 2023-08-30 DIAGNOSIS — E782 Mixed hyperlipidemia: Secondary | ICD-10-CM | POA: Diagnosis not present

## 2023-08-30 DIAGNOSIS — R7303 Prediabetes: Secondary | ICD-10-CM

## 2023-08-30 DIAGNOSIS — D509 Iron deficiency anemia, unspecified: Secondary | ICD-10-CM | POA: Diagnosis not present

## 2023-08-30 DIAGNOSIS — F418 Other specified anxiety disorders: Secondary | ICD-10-CM

## 2023-08-30 DIAGNOSIS — Z6841 Body Mass Index (BMI) 40.0 and over, adult: Secondary | ICD-10-CM

## 2023-08-30 DIAGNOSIS — N926 Irregular menstruation, unspecified: Secondary | ICD-10-CM

## 2023-08-30 DIAGNOSIS — E88819 Insulin resistance, unspecified: Secondary | ICD-10-CM

## 2023-08-30 LAB — POCT URINALYSIS DIP (CLINITEK)
Bilirubin, UA: NEGATIVE
Glucose, UA: NEGATIVE mg/dL
Ketones, POC UA: NEGATIVE mg/dL
Leukocytes, UA: NEGATIVE
Nitrite, UA: NEGATIVE
POC PROTEIN,UA: NEGATIVE
Spec Grav, UA: 1.025 (ref 1.010–1.025)
Urobilinogen, UA: 0.2 U/dL
pH, UA: 5 (ref 5.0–8.0)

## 2023-08-30 NOTE — Progress Notes (Signed)
Shawna Clamp, DNP, AGNP-c [facility] [address] [place], [place] [address] Main Office [phone]  BP 124/80   Pulse 64   Ht 5\' 8"  (1.727 m)   Wt (!) 335 lb (152 kg)   BMI 50.94 kg/m    Subjective:    Patient ID: Lacey Gibson, female    DOB: 01-16-1976, 47 y.o.   MRN: [id number]  HPI: Lacey Gibson is a 47 y.o. female presenting on 08/30/2023 for comprehensive medical examination.   History of Present Illness Lacey Gibson presents for CPE.    She has a history of irregular menstrual cycles and recent prolonged menstrual bleeding She reports a menstrual cycle lasting for approximately 30 days, characterized by light, intermittent spotting and dark brown discharge. This irregularity has been a recurring issue for the past three to four years, with episodes of heavy bleeding followed by prolonged spotting.  She also reports recent weight gain and mood changes, including feelings of unhappiness and increased stress over the past month. She expresses concern that these symptoms may be related to her irregular menstrual cycle and fear of an underlying health issue.  In addition to these primary concerns, she has been experiencing a persistent cough and feelings of coldness, which she attributes to allergies and weather changes. She also reports shortness of breath, particularly when climbing stairs or walking long distances.  She has a history of anemia and slightly elevated blood sugar levels, indicating potential insulin resistance. She also reports a history of irregular periods in her Zekiah Caruth twenties, which were managed with birth control for several years.  The patient denies any changes in bowel or bladder habits, hearing, or vision. She also denies any difficulty swallowing or tenderness in the abdominal region. However, she reports occasional choking incidents.   Pertinent items are noted in HPI.  IMMUNIZATIONS:   Flu Vaccine: Flu vaccine  given today Prevnar 13: Prevnar 13 N/A for this patient Prevnar 20: Prevnar 20 N/A for this patient Pneumovax 23: Pneumovax 23 N/A for this patient Vac Shingrix: Shingrix N/A for this patient HPV: N/A or Aged Out Tetanus: Tetanus completed in the last 10 years COVID: Due, completed today RSV: No  HEALTH MAINTENANCE: Pap Smear HM Status: is up to date Mammogram HM Status: is up to date Colon Cancer Screening HM Status: is due and will be scheduled by patient in the near future Bone Density HM Status: N/A STI Testing HM Status: was declined  Lung CT HM Status: N/A  Concerns with vision, hearing, or dentition: No   Most Recent Depression Screen:     08/30/2023    1:36 PM 03/10/2022    8:21 AM 08/06/2020    8:54 AM 07/16/2020    1:28 PM 11/17/2017    9:12 AM  Depression screen PHQ 2/9  Decreased Interest 1 0 2 0 0  Down, Depressed, Hopeless 1 0 2 0 0  PHQ - 2 Score 2 0 4 0 0  Altered sleeping 0  3    Tired, decreased energy 2  2    Change in appetite 2  2    Feeling bad or failure about yourself  3  1    Trouble concentrating 2  3    Moving slowly or fidgety/restless 0  0    Suicidal thoughts 0  1    PHQ-9 Score 11  16    Difficult doing work/chores Somewhat difficult  Somewhat difficult     Most Recent Anxiety Screen:     10/15/2019  2:28 PM  GAD 7 : Generalized Anxiety Score  Nervous, Anxious, on Edge 0  Control/stop worrying 0  Worry too much - different things 1  Trouble relaxing 1  Restless 0  Easily annoyed or irritable 1  Afraid - awful might happen 0  Total GAD 7 Score 3  Anxiety Difficulty Not difficult at all   Most Recent Fall Screen:    08/30/2023    1:36 PM 03/10/2022    8:20 AM 06/11/2021   11:23 AM 07/16/2020    1:28 PM  Fall Risk   Falls in the past year? 0 0 0 0  Number falls in past yr: 0 0 0 0  Injury with Fall? 0 0 0 0  Risk for fall due to : No Fall Risks No Fall Risks No Fall Risks   Follow up Falls evaluation completed Falls evaluation  completed Falls evaluation completed     Past medical history, surgical history, medications, allergies, family history and social history reviewed with patient today and changes made to appropriate areas of the chart.  Past Medical History:  Past Medical History:  Diagnosis Date   Anemia    Anxiety    Edema, lower extremity    GERD (gastroesophageal reflux disease)    Hyperlipidemia 11/23/2017   Morbid obesity (HCC) 12/18/2018   Prediabetes 11/23/2017   Sleep apnea    SOB (shortness of breath)    Medications:  Current Outpatient Medications on File Prior to Visit  Medication Sig   cholecalciferol (VITAMIN D3) 25 MCG (1000 UNIT) tablet Take 1,000 Units by mouth daily.   magnesium (MAGTAB) 84 MG ( ) TBCR SR tablet Take 84 mg by mouth.   Specialty Vitamins Products (HAIR NOURISHING SUPPLEMENT) TABS Take by mouth.   No current facility-administered medications on file prior to visit.   Surgical History:  No past surgical history on file. Allergies:  No Known Allergies Family History:  Family History  Problem Relation Age of Onset   Diabetes Mother    Hypertension Mother    Sleep apnea Mother    Obesity Mother    Alzheimer's disease Father 34   Heart disease Father    Healthy Daughter    Asthma Daughter    Diabetes Maternal Grandmother    Breast cancer Neg Hx        Objective:    BP 124/80   Pulse 64   Ht 5\' 8"  (1.727 m)   Wt (!) 335 lb (152 kg)   BMI 50.94 kg/m   Wt Readings from Last 3 Encounters:  08/30/23 (!) 335 lb (152 kg)  09/09/22 (!) 316 lb (143.3 kg)  03/10/22 (!) 308 lb 6.4 oz (139.9 kg)    Physical Exam Vitals and nursing note reviewed.  Constitutional:      General: She is not in acute distress.    Appearance: Normal appearance.  HENT:     Head: Normocephalic and atraumatic.     Right Ear: Hearing, tympanic membrane, ear canal and external ear normal.     Left Ear: Hearing, tympanic membrane, ear canal and external ear normal.     Nose:  Nose normal.     Right Sinus: No maxillary sinus tenderness or frontal sinus tenderness.     Left Sinus: No maxillary sinus tenderness or frontal sinus tenderness.     Mouth/Throat:     Lips: Pink.     Mouth: Mucous membranes are moist.     Pharynx: Oropharynx is clear.  Eyes:  General: Lids are normal. Vision grossly intact.     Extraocular Movements: Extraocular movements intact.     Conjunctiva/sclera: Conjunctivae normal.     Pupils: Pupils are equal, round, and reactive to light.     Funduscopic exam:    Right eye: Red reflex present.        Left eye: Red reflex present.    Visual Fields: Right eye visual fields normal and left eye visual fields normal.  Neck:     Thyroid: No thyromegaly.     Vascular: No carotid bruit.  Cardiovascular:     Rate and Rhythm: Normal rate and regular rhythm.     Chest Wall: PMI is not displaced.     Pulses: Normal pulses.          Dorsalis pedis pulses are 2+ on the right side and 2+ on the left side.       Posterior tibial pulses are 2+ on the right side and 2+ on the left side.     Heart sounds: Normal heart sounds. No murmur heard. Pulmonary:     Effort: Pulmonary effort is normal. No respiratory distress.     Breath sounds: Normal breath sounds.  Abdominal:     General: Abdomen is flat. Bowel sounds are normal. There is no distension.     Palpations: Abdomen is soft. There is no hepatomegaly, splenomegaly or mass.     Tenderness: There is no abdominal tenderness. There is no right CVA tenderness, left CVA tenderness, guarding or rebound.  Musculoskeletal:        General: Normal range of motion.     Cervical back: Full passive range of motion without pain, normal range of motion and neck supple. No tenderness.     Right lower leg: No edema.     Left lower leg: No edema.  Feet:     Left foot:     Toenail Condition: Left toenails are normal.  Lymphadenopathy:     Cervical: No cervical adenopathy.     Upper Body:     Right upper body:  No supraclavicular adenopathy.     Left upper body: No supraclavicular adenopathy.  Skin:    General: Skin is warm and dry.     Capillary Refill: Capillary refill takes less than 2 seconds.     Nails: There is no clubbing.  Neurological:     General: No focal deficit present.     Mental Status: She is alert and oriented to person, place, and time.     GCS: GCS eye subscore is 4. GCS verbal subscore is 5. GCS motor subscore is 6.     Sensory: Sensation is intact.     Motor: Motor function is intact.     Coordination: Coordination is intact.     Gait: Gait is intact.     Deep Tendon Reflexes: Reflexes are normal and symmetric.  Psychiatric:        Attention and Perception: Attention normal.        Mood and Affect: Mood normal.        Speech: Speech normal.        Behavior: Behavior normal. Behavior is cooperative.        Thought Content: Thought content normal.        Cognition and Memory: Cognition and memory normal.        Judgment: Judgment normal.     Results for orders placed or performed in visit on 08/30/23  Lipid panel  Result Value Ref  Range   Cholesterol, Total 249 (H) 100 - 199 mg/dL   Triglycerides 119 0 - 149 mg/dL   HDL 70 >14 mg/dL   VLDL Cholesterol Cal 17 5 - 40 mg/dL   LDL Chol Calc (NIH) 782 (H) 0 - 99 mg/dL   Chol/HDL Ratio 3.6 0.0 - 4.4 ratio  VITAMIN D 25 Hydroxy (Vit-D Deficiency, Fractures)  Result Value Ref Range   Vit D, 25-Hydroxy 24.7 (L) 30.0 - 100.0 ng/mL  Hemoglobin A1c  Result Value Ref Range   Hgb A1c MFr Bld 6.1 (H) 4.8 - 5.6 %   Est. average glucose Bld gHb Est-mCnc 128 mg/dL  CBC with Differential/Platelet  Result Value Ref Range   WBC 6.9 3.4 - 10.8 x10E3/uL   RBC 5.46 (H) 3.77 - 5.28 x10E6/uL   Hemoglobin 13.9 11.1 - 15.9 g/dL   Hematocrit 95.6 21.3 - 46.6 %   MCV 81 79 - 97 fL   MCH 25.5 (L) 26.6 - 33.0 pg   MCHC 31.4 (L) 31.5 - 35.7 g/dL   RDW 08.6 57.8 - 46.9 %   Platelets 300 150 - 450 x10E3/uL   Neutrophils 51 Not Estab. %    Lymphs 36 Not Estab. %   Monocytes 6 Not Estab. %   Eos 6 Not Estab. %   Basos 1 Not Estab. %   Neutrophils Absolute 3.5 1.4 - 7.0 x10E3/uL   Lymphocytes Absolute 2.5 0.7 - 3.1 x10E3/uL   Monocytes Absolute 0.4 0.1 - 0.9 x10E3/uL   EOS (ABSOLUTE) 0.4 0.0 - 0.4 x10E3/uL   Basophils Absolute 0.1 0.0 - 0.2 x10E3/uL   Immature Granulocytes 0 Not Estab. %   Immature Grans (Abs) 0.0 0.0 - 0.1 x10E3/uL  Comprehensive metabolic panel  Result Value Ref Range   Glucose 86 70 - 99 mg/dL   BUN 9 6 - 24 mg/dL   Creatinine, Ser 6.29 0.57 - 1.00 mg/dL   eGFR 80 >52 WU/XLK/4.40   BUN/Creatinine Ratio 10 9 - 23   Sodium 138 134 - 144 mmol/L   Potassium 4.4 3.5 - 5.2 mmol/L   Chloride 103 96 - 106 mmol/L   CO2 23 20 - 29 mmol/L   Calcium 9.3 8.7 - 10.2 mg/dL   Total Protein 7.3 6.0 - 8.5 g/dL   Albumin 4.1 3.9 - 4.9 g/dL   Globulin, Total 3.2 1.5 - 4.5 g/dL   Bilirubin Total 0.3 0.0 - 1.2 mg/dL   Alkaline Phosphatase 79 44 - 121 IU/L   AST 23 0 - 40 IU/L   ALT 20 0 - 32 IU/L  Iron, TIBC and Ferritin Panel  Result Value Ref Range   Total Iron Binding Capacity 360 250 - 450 ug/dL   UIBC 102 725 - 366 ug/dL   Iron 47 27 - 440 ug/dL   Iron Saturation 13 (L) 15 - 55 %   Ferritin 26 15 - 150 ng/mL  Testosterone, Total, LC/MS/MS  Result Value Ref Range   Testosterone, total 101.7 ng/dL  FSH/LH  Result Value Ref Range   LH 23.4 mIU/mL   FSH 24.9 mIU/mL  TSH + free T4  Result Value Ref Range   TSH 3.190 0.450 - 4.500 uIU/mL   Free T4 1.20 0.82 - 1.77 ng/dL  HIV Antibody (routine testing w rflx)  Result Value Ref Range   HIV Screen 4th Generation wRfx Non Reactive Non Reactive  Hepatitis C antibody  Result Value Ref Range   Hep C Virus Ab Non Reactive Non Reactive  POCT  URINALYSIS DIP (CLINITEK)  Result Value Ref Range   Color, UA yellow yellow   Clarity, UA cloudy (A) clear   Glucose, UA negative negative mg/dL   Bilirubin, UA negative negative   Ketones, POC UA negative negative  mg/dL   Spec Grav, UA 1.610 9.604 - 1.025   Blood, UA large (A) negative   pH, UA 5.0 5.0 - 8.0   POC PROTEIN,UA negative negative, trace   Urobilinogen, UA 0.2 0.2 or 1.0 E.U./dL   Nitrite, UA Negative Negative   Leukocytes, UA Negative Negative         Assessment & Plan:   Problem List Items Addressed This Visit     Prediabetes    Previous labs showed slightly elevated blood sugar levels. Discussed potential impact on menstrual cycle and mood. -Continue monitoring blood sugar levels. Labs pending -Advise on dietary changes to help manage blood sugar levels.      Relevant Orders   Hemoglobin A1c (Completed)   Vitamin D deficiency    Repeat labs today. Consider replacement for bone and muscle protection.       Relevant Orders   VITAMIN D 25 Hydroxy (Vit-D Deficiency, Fractures) (Completed)   Mixed hyperlipidemia    Chronic. Lipids monitored today. No alarm symptoms present at this time. LDL Goal < 100. Controlled with None at present. Will consider if LDL > 100 on recheck..  Plan: labs are reviewed, up to date and normal and orders written for new lab studies as appropriate; see orders Recommend Weight reduction  and Strict diet and exercise Diet and exercise recommendations provided.  Follow-up in 6months or sooner based on lab findings as appropriate.         Relevant Orders   Lipid panel (Completed)   Hemoglobin A1c (Completed)   CBC with Differential/Platelet (Completed)   Comprehensive metabolic panel (Completed)   Class 3 severe obesity with body mass index (BMI) of 50.0 to 59.9 in adult [facility])    Chronic. BMO 50.94 today. Recommend lifestyle changes to help balance and improve management to prevent complications. Will monitor labs today.       Perimenopause    Menstrual changes and mood changes present. Labs pending. Consider [place] to ensure that no other changes are present at this time. Consider OCP to help stabilize.       Relevant Orders   Testosterone,  Total, LC/MS/MS (Completed)   FSH/LH (Completed)   TSH + free T4 (Completed)   Iron deficiency anemia    Monitor labs today. Reports of shortness of breath with activity, particularly climbing stairs. Will ensure that this is not a component contributing.       Relevant Orders   Iron, TIBC and Ferritin Panel (Completed)   Mixed anxiety and depressive disorder    Reports feeling stressed and unhappy. Possible link to hormonal changes or thyroid dysfunction. -Consider further evaluation depending on lab results. -Discuss potential treatment options including low-dose birth control or other mood stabilizing medications.      Relevant Orders   Testosterone, Total, LC/MS/MS (Completed)   FSH/LH (Completed)   TSH + free T4 (Completed)   Abnormal menses    Prolonged menstrual bleeding for 30 days, possibly due to stress or hormonal changes. Discussed potential perimenopausal symptoms and the need for further investigation. -Order labs to check hormone levels including thyroid function. -Consider ultrasound to evaluate for potential ovarian cysts.      Relevant Orders   Hemoglobin A1c (Completed)   Testosterone, Total, LC/MS/MS (Completed)  FSH/LH (Completed)   TSH + free T4 (Completed)   Encounter for annual physical exam - Primary    CPE completed today. Review of HM activities and recommendations discussed and provided on AVS. Anticipatory guidance, diet, and exercise recommendations provided. Medications, allergies, and hx reviewed and updated as necessary. Orders placed as listed below.  Plan: - Labs ordered. Will make changes as necessary based on results.  - I will review these results and send recommendations via MyChart or a telephone call.  - F/U with CPE in 1 year or sooner for acute/chronic health needs as directed.        Relevant Orders   Lipid panel (Completed)   VITAMIN D 25 Hydroxy (Vit-D Deficiency, Fractures) (Completed)   Hemoglobin A1c (Completed)   CBC with  Differential/Platelet (Completed)   Comprehensive metabolic panel (Completed)   Iron, TIBC and Ferritin Panel (Completed)   Testosterone, Total, LC/MS/MS (Completed)   FSH/LH (Completed)   TSH + free T4 (Completed)   Dyspnea on exertion    Reports shortness of breath, particularly when climbing stairs. Possible link to current cold/allergy symptoms or potential asthma. -Consider trial of albuterol inhaler for symptom relief. -Evaluate response to albuterol and consider long-term inhaler if beneficial. - Will monitor iron levels today to ensure that this is not contributing.      Other Visit Diagnoses     Insulin resistance       Relevant Orders   Lipid panel (Completed)   Hemoglobin A1c (Completed)   CBC with Differential/Platelet (Completed)   Comprehensive metabolic panel (Completed)   Testosterone, Total, LC/MS/MS (Completed)   Need for COVID-19 vaccine       Relevant Orders   Pfizer Comirnaty Covid -19 Vaccine 73yrs and older (Completed)   Need for influenza vaccination       Relevant Orders   Flu vaccine trivalent PF, 6mos and older(Flulaval,Afluria,Fluarix,Fluzone) (Completed)   Screening for colon cancer       Relevant Orders   Ambulatory referral to Gastroenterology   Encounter for hepatitis C screening test for low risk patient       Relevant Orders   Hepatitis C antibody (Completed)   Screening for HIV without presence of risk factors       Relevant Orders   HIV Antibody (routine testing w rflx) (Completed)   Hematuria, unspecified type       Relevant Orders   POCT URINALYSIS DIP (CLINITEK) (Completed)          Follow up plan: Return in about 1 year (around 08/29/2024) for CPE.  NEXT PREVENTATIVE PHYSICAL DUE IN 1 YEAR.  PATIENT COUNSELING PROVIDED FOR ALL ADULT PATIENTS: A well balanced diet low in saturated fats, cholesterol, and moderation in carbohydrates.  This can be as simple as monitoring portion sizes and cutting back on sugary beverages such as  soda and juice to start with.    Daily water consumption of at least 64 ounces.  Physical activity at least 180 minutes per week.  If just starting out, start 10 minutes a day and work your way up.   This can be as simple as taking the stairs instead of the elevator and walking 2-3 laps around the office  purposefully every day.   STD protection, partner selection, and regular testing if high risk.  Limited consumption of alcoholic beverages if alcohol is consumed. For men, I recommend no more than 14 alcoholic beverages per week, spread out throughout the week (max 2 per day). Avoid "binge" drinking or consuming  large quantities of alcohol in one setting.  Please let me know if you feel you may need help with reduction or quitting alcohol consumption.   Avoidance of nicotine, if used. Please let me know if you feel you may need help with reduction or quitting nicotine use.   Daily mental health attention. This can be in the form of 5 minute daily meditation, prayer, journaling, yoga, reflection, etc.  Purposeful attention to your emotions and mental state can significantly improve your overall wellbeing  and  Health.  Please know that I am here to help you with all of your health care goals and am happy to work with you to find a solution that works best for you.  The greatest advice I have received with any changes in life are to take it one step at a time, that even means if all you can focus on is the next 60 seconds, then do that and celebrate your victories.  With any changes in life, you will have set backs, and that is OK. The important thing to remember is, if you have a set back, it is not a failure, it is an opportunity to try again! Screening Testing Mammogram Every 1 -2 years based on history and risk factors Starting at age 19 Pap Smear Ages 21-39 every 3 years Ages 50-65 every 5 years with HPV testing More frequent testing may be required based on results and  history Colon Cancer Screening Every 1-10 years based on test performed, risk factors, and history Starting at age 85 Bone Density Screening Every 2-10 years based on history Starting at age 22 for women Recommendations for men differ based on medication usage, history, and risk factors AAA Screening One time ultrasound Men 30-50 years old who have every smoked Lung Cancer Screening Low Dose Lung CT every 12 months Age 4-80 years with a 30 pack-year smoking history who still smoke or who have quit within the last 15 years   Screening Labs Routine  Labs: Complete Blood Count (CBC), Complete Metabolic Panel (CMP), Cholesterol (Lipid Panel) Every 6-12 months based on history and medications May be recommended more frequently based on current conditions or previous results Hemoglobin A1c Lab Every 3-12 months based on history and previous results Starting at age 52 or earlier with diagnosis of diabetes, high cholesterol, BMI >26, and/or risk factors Frequent monitoring for patients with diabetes to ensure blood sugar control Thyroid Panel (TSH) Every 6 months based on history, symptoms, and risk factors May be repeated more often if on medication HIV One time testing for all patients 47 and older May be repeated more frequently for patients with increased risk factors or exposure Hepatitis C One time testing for all patients 75 and older May be repeated more frequently for patients with increased risk factors or exposure Gonorrhea, Chlamydia Every 12 months for all sexually active persons 13-24 years Additional monitoring may be recommended for those who are considered high risk or who have symptoms Every 12 months for any woman on birth control, regardless of sexual activity PSA Men 31-33 years old with risk factors Additional screening may be recommended from age 65-69 based on risk factors, symptoms, and history  Vaccine Recommendations Tetanus Booster All adults every 10  years Flu Vaccine All patients 6 months and older every year COVID Vaccine All patients 12 years and older Initial dosing with booster May recommend additional booster based on age and health history HPV Vaccine 2 doses all patients age 75-26 Dosing may  be considered for patients over 26 Shingles Vaccine (Shingrix) 2 doses all adults 55 years and older Pneumonia (Pneumovax 23) All adults 65 years and older May recommend earlier dosing based on health history One year apart from Prevnar 13 Pneumonia (Prevnar 25) All adults 65 years and older Dosed 1 year after Pneumovax 23 Pneumonia (Prevnar 20) One time alternative to the two dosing of 13 and 23 For all adults with initial dose of 23, 20 is recommended 1 year later For all adults with initial dose of 13, 23 is still recommended as second option 1 year later

## 2023-08-30 NOTE — Patient Instructions (Signed)
Perimenopause Perimenopause is the normal time of a woman's life when the levels of estrogen, the female hormone produced by the ovaries, begin to decrease. This leads to changes in menstrual periods before they stop completely (menopause). Perimenopause can begin 2-8 years before menopause. During perimenopause, the ovaries may or may not produce an egg and a woman can still become pregnant. What are the causes? This condition is caused by a natural change in hormone levels that happens as you get older. What increases the risk? This condition is more likely to start at an earlier age if you have certain medical conditions or have undergone treatments, including: A tumor of the pituitary gland in the brain. A disease that affects the ovaries and hormone production. Certain cancer treatments, such as chemotherapy or hormone therapy, or radiation therapy on the pelvis. Heavy smoking and excessive alcohol use. Family history of early menopause. What are the signs or symptoms? Perimenopausal changes affect each woman differently. Symptoms of this condition may include: Hot flashes. Irregular menstrual periods. Night sweats. Changes in feelings about sex. This could be a decrease in sex drive or an increased discomfort around your sexuality. Vaginal dryness. Headaches. Mood swings. Depression. Problems sleeping (insomnia). Memory problems or trouble concentrating. Irritability. Tiredness. Weight gain. Anxiety. Trouble getting pregnant. How is this diagnosed? This condition is diagnosed based on your medical history, a physical exam, your age, your menstrual history, and your symptoms. Hormone tests may also be done. How is this treated? In some cases, no treatment is needed. You and your health care provider should make a decision together about whether treatment is necessary. Treatment will be based on your individual condition and preferences. Various treatments are available, such  as: Menopausal hormone therapy (MHT). Medicines to treat specific symptoms. Acupuncture. Vitamin or herbal supplements. Before starting treatment, make sure to let your health care provider know if you have a personal or family history of: Heart disease. Breast cancer. Blood clots. Diabetes. Osteoporosis. Follow these instructions at home: Medicines Take over-the-counter and prescription medicines only as told by your health care provider. Take vitamin supplements only as told by your health care provider. Talk with your health care provider before starting any herbal supplements. Lifestyle  Do not use any products that contain nicotine or tobacco, such as cigarettes, e-cigarettes, and chewing tobacco. If you need help quitting, ask your health care provider. Get at least 30 minutes of physical activity on 5 or more days each week. Eat a balanced diet that includes fresh fruits and vegetables, whole grains, soybeans, eggs, lean meat, and low-fat dairy. Avoid alcoholic and caffeinated beverages, as well as spicy foods. This may help prevent hot flashes. Get 7-8 hours of sleep each night. Dress in layers that can be removed to help you manage hot flashes. Find ways to manage stress, such as deep breathing, meditation, or journaling. General instructions  Keep track of your menstrual periods, including: When they occur. How heavy they are and how long they last. How much time passes between periods. Keep track of your symptoms, noting when they start, how often you have them, and how long they last. Use vaginal lubricants or moisturizers to help with vaginal dryness and improve comfort during sex. You can still become pregnant if you are having irregular periods. Make sure you use contraception during perimenopause if you do not want to get pregnant. Keep all follow-up visits. This is important. This includes any group therapy or counseling. Contact a health care provider if: You  have  heavy vaginal bleeding or pass blood clots. Your period lasts more than 2 days longer than normal. Your periods are recurring sooner than 21 days. You bleed after having sex. You have pain during sex. Get help right away if you have: Chest pain, trouble breathing, or trouble talking. Severe depression. Pain when you urinate. Severe headaches. Vision problems. Summary Perimenopause is the time when a woman's body begins to move into menopause. This may happen naturally or as a result of other health problems or medical treatments. Perimenopause can begin 2-8 years before menopause, and it can last for several years. Perimenopausal symptoms can be managed through medicines, lifestyle changes, and complementary therapies such as acupuncture. This information is not intended to replace advice given to you by your health care provider. Make sure you discuss any questions you have with your health care provider. Document Revised: 05/01/2020 Document Reviewed: 05/01/2020 Elsevier Patient Education  2024 [employer].

## 2023-08-31 LAB — HIV ANTIBODY (ROUTINE TESTING W REFLEX): HIV Screen 4th Generation wRfx: NONREACTIVE

## 2023-08-31 LAB — HEPATITIS C ANTIBODY: Hep C Virus Ab: NONREACTIVE

## 2023-09-04 LAB — TSH+FREE T4
Free T4: 1.2 ng/dL (ref 0.82–1.77)
TSH: 3.19 u[IU]/mL (ref 0.450–4.500)

## 2023-09-04 LAB — CBC WITH DIFFERENTIAL/PLATELET
Basophils Absolute: 0.1 10*3/uL (ref 0.0–0.2)
Basos: 1 %
EOS (ABSOLUTE): 0.4 10*3/uL (ref 0.0–0.4)
Eos: 6 %
Hematocrit: 44.3 % (ref 34.0–46.6)
Hemoglobin: 13.9 g/dL (ref 11.1–15.9)
Immature Grans (Abs): 0 10*3/uL (ref 0.0–0.1)
Immature Granulocytes: 0 %
Lymphocytes Absolute: 2.5 10*3/uL (ref 0.7–3.1)
Lymphs: 36 %
MCH: 25.5 pg — ABNORMAL LOW (ref 26.6–33.0)
MCHC: 31.4 g/dL — ABNORMAL LOW (ref 31.5–35.7)
MCV: 81 fL (ref 79–97)
Monocytes Absolute: 0.4 10*3/uL (ref 0.1–0.9)
Monocytes: 6 %
Neutrophils Absolute: 3.5 10*3/uL (ref 1.4–7.0)
Neutrophils: 51 %
Platelets: 300 10*3/uL (ref 150–450)
RBC: 5.46 x10E6/uL — ABNORMAL HIGH (ref 3.77–5.28)
RDW: 13.4 % (ref 11.7–15.4)
WBC: 6.9 10*3/uL (ref 3.4–10.8)

## 2023-09-04 LAB — COMPREHENSIVE METABOLIC PANEL
ALT: 20 [IU]/L (ref 0–32)
AST: 23 [IU]/L (ref 0–40)
Albumin: 4.1 g/dL (ref 3.9–4.9)
Alkaline Phosphatase: 79 [IU]/L (ref 44–121)
BUN/Creatinine Ratio: 10 (ref 9–23)
BUN: 9 mg/dL (ref 6–24)
Bilirubin Total: 0.3 mg/dL (ref 0.0–1.2)
CO2: 23 mmol/L (ref 20–29)
Calcium: 9.3 mg/dL (ref 8.7–10.2)
Chloride: 103 mmol/L (ref 96–106)
Creatinine, Ser: 0.9 mg/dL (ref 0.57–1.00)
Globulin, Total: 3.2 g/dL (ref 1.5–4.5)
Glucose: 86 mg/dL (ref 70–99)
Potassium: 4.4 mmol/L (ref 3.5–5.2)
Sodium: 138 mmol/L (ref 134–144)
Total Protein: 7.3 g/dL (ref 6.0–8.5)
eGFR: 80 mL/min/{1.73_m2} (ref >59)

## 2023-09-04 LAB — VITAMIN D 25 HYDROXY (VIT D DEFICIENCY, FRACTURES): Vit D, 25-Hydroxy: 24.7 ng/mL — ABNORMAL LOW (ref 30.0–100.0)

## 2023-09-04 LAB — IRON,TIBC AND FERRITIN PANEL
Ferritin: 26 ng/mL (ref 15–150)
Iron Saturation: 13 % — ABNORMAL LOW (ref 15–55)
Iron: 47 ug/dL (ref 27–159)
Total Iron Binding Capacity: 360 ug/dL (ref 250–450)
UIBC: 313 ug/dL (ref 131–425)

## 2023-09-04 LAB — LIPID PANEL
Chol/HDL Ratio: 3.6 {ratio} (ref 0.0–4.4)
Cholesterol, Total: 249 mg/dL — ABNORMAL HIGH (ref 100–199)
HDL: 70 mg/dL (ref >39)
LDL Chol Calc (NIH): 162 mg/dL — ABNORMAL HIGH (ref 0–99)
Triglycerides: 101 mg/dL (ref 0–149)
VLDL Cholesterol Cal: 17 mg/dL (ref 5–40)

## 2023-09-04 LAB — TESTOSTERONE, TOTAL, LC/MS/MS: Testosterone, total: 101.7 ng/dL

## 2023-09-04 LAB — HEMOGLOBIN A1C
Est. average glucose Bld gHb Est-mCnc: 128 mg/dL
Hgb A1c MFr Bld: 6.1 % — ABNORMAL HIGH (ref 4.8–5.6)

## 2023-09-04 LAB — FSH/LH
FSH: 24.9 m[IU]/mL
LH: 23.4 m[IU]/mL

## 2023-09-05 ENCOUNTER — Other Ambulatory Visit: Payer: Self-pay

## 2023-09-05 DIAGNOSIS — R0609 Other forms of dyspnea: Secondary | ICD-10-CM | POA: Insufficient documentation

## 2023-09-05 HISTORY — DX: Other forms of dyspnea: R06.09

## 2023-09-05 MED ORDER — METFORMIN HCL 500 MG PO TABS: 500.0000 mg | ORAL_TABLET | Freq: Every day | ORAL | 1 refills | Status: DC

## 2023-09-05 MED ORDER — VITAMIN D (ERGOCALCIFEROL) 1.25 MG (50000 UNIT) PO CAPS: 50000.0000 [IU] | ORAL_CAPSULE | ORAL | 1 refills | Status: DC

## 2023-09-05 MED ORDER — ATORVASTATIN CALCIUM 20 MG PO TABS: 20.0000 mg | ORAL_TABLET | Freq: Every day | ORAL | 1 refills | Status: DC

## 2023-09-05 NOTE — Assessment & Plan Note (Signed)
Reports feeling stressed and unhappy. Possible link to hormonal changes or thyroid dysfunction. -Consider further evaluation depending on lab results. -Discuss potential treatment options including low-dose birth control or other mood stabilizing medications.

## 2023-09-05 NOTE — Assessment & Plan Note (Signed)
Monitor labs today. Reports of shortness of breath with activity, particularly climbing stairs. Will ensure that this is not a component contributing.

## 2023-09-05 NOTE — Assessment & Plan Note (Signed)
Menstrual changes and mood changes present. Labs pending. Consider [place] to ensure that no other changes are present at this time. Consider OCP to help stabilize.

## 2023-09-05 NOTE — Assessment & Plan Note (Signed)

## 2023-09-05 NOTE — Assessment & Plan Note (Signed)
Previous labs showed slightly elevated blood sugar levels. Discussed potential impact on menstrual cycle and mood. -Continue monitoring blood sugar levels. Labs pending -Advise on dietary changes to help manage blood sugar levels.

## 2023-09-05 NOTE — Assessment & Plan Note (Signed)
Repeat labs today. Consider replacement for bone and muscle protection.

## 2023-09-05 NOTE — Assessment & Plan Note (Signed)
Chronic. Lipids monitored today. No alarm symptoms present at this time. LDL Goal < 100. Controlled with None at present. Will consider if LDL > 100 on recheck..  Plan: labs are reviewed, up to date and normal and orders written for new lab studies as appropriate; see orders Recommend Weight reduction  and Strict diet and exercise Diet and exercise recommendations provided.  Follow-up in 6months or sooner based on lab findings as appropriate.

## 2023-09-05 NOTE — Assessment & Plan Note (Signed)
Reports shortness of breath, particularly when climbing stairs. Possible link to current cold/allergy symptoms or potential asthma. -Consider trial of albuterol inhaler for symptom relief. -Evaluate response to albuterol and consider long-term inhaler if beneficial. - Will monitor iron levels today to ensure that this is not contributing.

## 2023-09-05 NOTE — Assessment & Plan Note (Signed)
Prolonged menstrual bleeding for 30 days, possibly due to stress or hormonal changes. Discussed potential perimenopausal symptoms and the need for further investigation. -Order labs to check hormone levels including thyroid function. -Consider ultrasound to evaluate for potential ovarian cysts.

## 2023-09-05 NOTE — Assessment & Plan Note (Signed)
Chronic. BMO 50.94 today. Recommend lifestyle changes to help balance and improve management to prevent complications. Will monitor labs today.

## 2023-09-08 ENCOUNTER — Ambulatory Visit (HOSPITAL_COMMUNITY)
Admission: RE | Admit: 2023-09-08 | Discharge: 2023-09-08 | Disposition: A | Discharge status: Home / Self Care | Payer: No Typology Code available for payment source | Source: Ambulatory Visit | Attending: Obstetrics and Gynecology | Admitting: Obstetrics and Gynecology

## 2023-09-08 DIAGNOSIS — N939 Abnormal uterine and vaginal bleeding, unspecified: Secondary | ICD-10-CM | POA: Diagnosis present

## 2023-09-09 ENCOUNTER — Other Ambulatory Visit: Payer: Self-pay | Admitting: Nurse Practitioner

## 2023-09-15 ENCOUNTER — Encounter: Payer: Self-pay | Admitting: Nurse Practitioner

## 2023-09-23 ENCOUNTER — Other Ambulatory Visit: Payer: No Typology Code available for payment source

## 2023-09-23 ENCOUNTER — Other Ambulatory Visit: Payer: Self-pay | Admitting: Nurse Practitioner

## 2023-09-23 DIAGNOSIS — N926 Irregular menstruation, unspecified: Secondary | ICD-10-CM

## 2023-09-23 MED ORDER — NORETHIN ACE-ETH ESTRAD-FE 1-20 MG-MCG PO TABS
1.0000 | ORAL_TABLET | Freq: Every day | ORAL | 3 refills | Status: DC
Start: 2023-09-23 — End: 2023-10-07

## 2023-10-07 ENCOUNTER — Encounter: Payer: Self-pay | Admitting: Nurse Practitioner

## 2023-10-07 ENCOUNTER — Telehealth: Payer: Self-pay

## 2023-10-07 ENCOUNTER — Other Ambulatory Visit (INDEPENDENT_AMBULATORY_CARE_PROVIDER_SITE_OTHER): Payer: No Typology Code available for payment source

## 2023-10-07 DIAGNOSIS — R319 Hematuria, unspecified: Secondary | ICD-10-CM

## 2023-10-07 LAB — POCT URINALYSIS DIP (CLINITEK)
Bilirubin, UA: NEGATIVE
Blood, UA: NEGATIVE
Glucose, UA: NEGATIVE mg/dL
Ketones, POC UA: NEGATIVE mg/dL
Leukocytes, UA: NEGATIVE
Nitrite, UA: NEGATIVE
POC PROTEIN,UA: NEGATIVE
Spec Grav, UA: 1.015
Urobilinogen, UA: 0.2 U/dL
pH, UA: 6

## 2023-10-07 MED ORDER — NORGESTIM-ETH ESTRAD TRIPHASIC 0.18/0.215/0.25 MG-25 MCG PO TABS: 1.0000 | ORAL_TABLET | Freq: Every day | ORAL | 11 refills | Status: DC

## 2023-10-07 NOTE — Telephone Encounter (Signed)
Pt states her birth control, loestrin, is causing her to have swollen ankles. Can message her via MyChart.

## 2023-11-09 ENCOUNTER — Encounter: Payer: Self-pay | Admitting: Obstetrics and Gynecology

## 2023-11-09 ENCOUNTER — Telehealth: Payer: No Typology Code available for payment source | Admitting: Obstetrics and Gynecology

## 2023-11-09 DIAGNOSIS — Z712 Person consulting for explanation of examination or test findings: Secondary | ICD-10-CM

## 2023-11-09 NOTE — Progress Notes (Signed)
GYNECOLOGY VIRTUAL VISIT ENCOUNTER NOTE  Provider location: Center for [facility]   Patient location: Home  I connected with Lacey Gibson on 11/09/23 at 11:15 AM EST by MyChart Video Encounter and verified that I am speaking with the correct person using two identifiers.   I discussed the limitations, risks, security and privacy concerns of performing an evaluation and management service virtually and the availability of in person appointments. I also discussed with the patient that there may be a patient responsible charge related to this service. The patient expressed understanding and agreed to proceed.   History:  Lacey Gibson is a 47 y.o. [id number] female being evaluated to discuss results of recent ultrasound. She denies any abnormal vaginal discharge, pelvic pain or other concerns. Her periods continue to remain irregular, often skipping cycles or they can be as light as vaginal spotting followed by heavy flow       Past Medical History:  Diagnosis Date   Anemia    Anxiety    Edema, lower extremity    GERD (gastroesophageal reflux disease)    Hyperlipidemia 11/23/2017   Morbid obesity (HCC) 12/18/2018   Prediabetes 11/23/2017   Sleep apnea    SOB (shortness of breath)    No past surgical history on file. The following portions of the patient's history were reviewed and updated as appropriate: allergies, current medications, past family history, past medical history, past social history, past surgical history and problem list.   Health Maintenance:  Normal pap and negative HRHPV on 08/2022.  Normal mammogram on 06/2023.   Review of Systems:  Pertinent items noted in HPI and remainder of comprehensive ROS otherwise negative.  Physical Exam:   General:  Alert, oriented and cooperative. Patient appears to be in no acute distress.  Mental Status: Normal mood and affect. Normal behavior. Normal judgment and thought content.   Respiratory:  Normal respiratory effort, no problems with respiration noted  Rest of physical exam deferred due to type of encounter  Labs and Imaging No results found for this or any previous visit (from the past 336 hour(s)). No results found.    US PELVIC COMPLETE WITH TRANSVAGINAL  Result Date: 09/19/2023 CLINICAL DATA:  Abnormal uterine bleeding EXAM: TRANSABDOMINAL AND TRANSVAGINAL ULTRASOUND OF PELVIS TECHNIQUE: Both transabdominal and transvaginal ultrasound examinations of the pelvis were performed. Transabdominal technique was performed for global imaging of the pelvis including uterus, ovaries, adnexal regions, and pelvic cul-de-sac. It was necessary to proceed with endovaginal exam following the transabdominal exam to visualize the left ovary and right adnexa. COMPARISON:  None Available. FINDINGS: Uterus Measurements: 11.6 x 7 x 4 x 9.4 cm = volume: 421 mL. Multiple uterine fibroids, including: --5.5 x 4.3 x 5.3 cm intramural fibroid in the left anterior fundus --3.1 x 3.5 x 3.6 cm intramural fibroid in the right anterior uterine body --1.2 x 1.0 x 0.9 cm intracavitary fibroid in the uterine body Endometrium Thickness: 13 mm.  No focal abnormality visualized. Right ovary Not discretely visualized.  No adnexal mass is seen. Left ovary Measurements: 4.2 x 3.0 x 3.1 cm = volume: 20.5 mL. Normal appearance/no adnexal mass. Other findings Right pelvic fluid, likely physiologic. IMPRESSION: Multiple uterine fibroids, including a 12 mm cavitary fibroid, as above. Right ovary not discretely visualized. No adnexal mass is seen. Electronically Signed   By: Charline Bills M.D.   On: 09/19/2023 20:41    Assessment and Plan:     1. Encounter to discuss test results -  Reviewed ultrasound findings of fibroid uterus - Given irregularity of menses, AUB can be due to hormonal changes of perimenopause state - Discussed option for medical management with Mirena IUD (which patient is interested in), surgical management  with Sonata or endometrial ablation. - Information provided and patient is leaning towards IUD insertion. Patient to return when ready for IUD insertion      I discussed the assessment and treatment plan with the patient. The patient was provided an opportunity to ask questions and all were answered. The patient agreed with the plan and demonstrated an understanding of the instructions.   The patient was advised to call back or seek an in-person evaluation/go to the ED if the symptoms worsen or if the condition fails to improve as anticipated.  I provided 10 minutes of face-to-face time during this encounter.   Lacey Antigua, MD Center for [employer], [facility]

## 2023-11-09 NOTE — Patient Instructions (Signed)
Endometrial Ablation Endometrial ablation is a procedure that destroys the thin inner layer of the lining of the uterus (endometrium). This procedure may be done: To stop heavy menstrual periods. To stop bleeding that is causing anemia. To control irregular bleeding. To treat bleeding caused by small tumors (fibroids) in the endometrium. This procedure is often done as an alternative to major surgery, such as removal of the uterus and cervix (hysterectomy). As a result of this procedure: You may not be able to have children. However, if you have not yet gone through menopause: You may still have a small chance of getting pregnant. You will need to use a reliable method of birth control after the procedure to prevent pregnancy. You may stop having a menstrual period, or you may have only a small amount of bleeding during your period. Menstruation may return several years after the procedure. Tell a health care provider about: Any allergies you have. All medicines you are taking, including vitamins, herbs, eye drops, creams, and over-the-counter medicines. Any problems you or family members have had with the use of anesthetic medicines. Any blood disorders you have. Any surgeries you have had. Any medical conditions you have. Whether you are pregnant or may be pregnant. What are the risks? Generally, this is a safe procedure. However, problems may occur, including: A hole (perforation) in the uterus or bowel. Infection in the uterus, bladder, or vagina. Bleeding. Allergic reaction to medicines. Damage to nearby structures or organs. An air bubble in the lung (air embolus). Problems with pregnancy. Failure of the procedure. Decreased ability to diagnose cancer in the endometrium. Scar tissue forms after the procedure, making it more difficult to get a sample of the uterine lining. What happens before the procedure? Medicines Ask your health care provider about: Changing or stopping your  regular medicines. This is especially important if you take diabetes medicines or blood thinners. Taking medicines such as aspirin and ibuprofen. These medicines can thin your blood. Do not take these medicines before your procedure if your doctor tells you not to take them. Taking over-the-counter medicines, vitamins, herbs, and supplements. Tests You will have tests of your endometrium to make sure there are no precancerous cells or cancer cells present. You may have an ultrasound of the uterus. General instructions Do not use any products that contain nicotine or tobacco for at least 4 weeks before the procedure. These include cigarettes, chewing tobacco, and vaping devices, such as e-cigarettes. If you need help quitting, ask your health care provider. You may be given medicines to thin the endometrium. Ask your health care provider what steps will be taken to help prevent infection. These steps may include: Removing hair at the surgery site. Washing skin with a germ-killing soap. Taking antibiotic medicine. Plan to have a responsible adult take you home from the hospital or clinic. Plan to have a responsible adult care for you for the time you are told after you leave the hospital or clinic. This is important. What happens during the procedure?  You will lie on an exam table with your feet and legs supported as in a pelvic exam. An IV will be inserted into one of your veins. You will be given a medicine to help you relax (sedative). A surgical tool with a light and camera (resectoscope) will be inserted into your vagina and moved into your uterus. This allows your surgeon to see inside your uterus. Endometrial tissue will be destroyed and removed, using one of the following methods: Radiofrequency. This  uses an electrical current to destroy the endometrium. Cryotherapy. This uses extreme cold to freeze the endometrium. Heated fluid. This uses a heated salt and water (saline) solution to  destroy the endometrium. Microwave. This uses high-energy microwaves to heat up the endometrium and destroy it. Thermal balloon. This involves inserting a catheter with a balloon tip into the uterus. The balloon tip is filled with heated fluid to destroy the endometrium. The procedure may vary among health care providers and hospitals. What happens after the procedure? Your blood pressure, heart rate, breathing rate, and blood oxygen level will be monitored until you leave the hospital or clinic. You may have vaginal bleeding for 4-6 weeks after the procedure. You may also have: Cramps. A thin, watery vaginal discharge that is light pink or brown. A need to urinate more than usual. Nausea. If you were given a sedative during the procedure, it can affect you for several hours. Do not drive or operate machinery until your health care provider says that it is safe. Do not have sex or insert anything into your vagina until your health care provider says it is safe. Summary Endometrial ablation is done to treat many causes of heavy menstrual bleeding. The procedure destroys the thin inner layer of the lining of the uterus (endometrium). This procedure is often done as an alternative to major surgery, such as removal of the uterus and cervix (hysterectomy). Plan to have a responsible adult take you home from the hospital or clinic. This information is not intended to replace advice given to you by your health care provider. Make sure you discuss any questions you have with your health care provider. Document Revised: 06/05/2020 Document Reviewed: 06/05/2020 Elsevier Patient Education  2024 [employer].

## 2023-12-09 ENCOUNTER — Other Ambulatory Visit: Payer: No Typology Code available for payment source

## 2023-12-15 ENCOUNTER — Encounter: Payer: Self-pay | Admitting: Nurse Practitioner

## 2023-12-15 ENCOUNTER — Ambulatory Visit: Payer: No Typology Code available for payment source | Admitting: Obstetrics and Gynecology

## 2024-02-01 ENCOUNTER — Other Ambulatory Visit: Payer: No Typology Code available for payment source

## 2024-02-01 ENCOUNTER — Other Ambulatory Visit: Payer: Self-pay

## 2024-02-01 DIAGNOSIS — D509 Iron deficiency anemia, unspecified: Secondary | ICD-10-CM

## 2024-02-01 DIAGNOSIS — R7303 Prediabetes: Secondary | ICD-10-CM

## 2024-02-01 DIAGNOSIS — E559 Vitamin D deficiency, unspecified: Secondary | ICD-10-CM

## 2024-02-02 LAB — LIPID PANEL
Chol/HDL Ratio: 3.2 ratio (ref 0.0–4.4)
Cholesterol, Total: 212 mg/dL — ABNORMAL HIGH (ref 100–199)
HDL: 67 mg/dL (ref >39)
LDL Chol Calc (NIH): 133 mg/dL — ABNORMAL HIGH (ref 0–99)
Triglycerides: 69 mg/dL (ref 0–149)
VLDL Cholesterol Cal: 12 mg/dL (ref 5–40)

## 2024-02-02 LAB — VITAMIN D 25 HYDROXY (VIT D DEFICIENCY, FRACTURES): Vit D, 25-Hydroxy: 32.8 ng/mL (ref 30.0–100.0)

## 2024-02-02 LAB — CMP14+EGFR
ALT: 19 IU/L (ref 0–32)
AST: 18 IU/L (ref 0–40)
Albumin: 3.9 g/dL (ref 3.9–4.9)
Alkaline Phosphatase: 79 IU/L (ref 44–121)
BUN/Creatinine Ratio: 11 (ref 9–23)
BUN: 10 mg/dL (ref 6–24)
Bilirubin Total: 0.3 mg/dL (ref 0.0–1.2)
CO2: 20 mmol/L (ref 20–29)
Calcium: 9.2 mg/dL (ref 8.7–10.2)
Chloride: 107 mmol/L — ABNORMAL HIGH (ref 96–106)
Creatinine, Ser: 0.95 mg/dL (ref 0.57–1.00)
Globulin, Total: 2.8 g/dL (ref 1.5–4.5)
Glucose: 108 mg/dL — ABNORMAL HIGH (ref 70–99)
Potassium: 4.9 mmol/L (ref 3.5–5.2)
Sodium: 141 mmol/L (ref 134–144)
Total Protein: 6.7 g/dL (ref 6.0–8.5)
eGFR: 75 mL/min/{1.73_m2} (ref >59)

## 2024-02-09 ENCOUNTER — Encounter: Payer: Self-pay | Admitting: Nurse Practitioner

## 2024-02-10 ENCOUNTER — Encounter: Payer: Self-pay | Admitting: Nurse Practitioner

## 2024-02-26 ENCOUNTER — Other Ambulatory Visit: Payer: Self-pay | Admitting: Nurse Practitioner

## 2024-03-01 ENCOUNTER — Other Ambulatory Visit: Payer: Self-pay

## 2024-03-01 ENCOUNTER — Encounter (HOSPITAL_COMMUNITY): Payer: Self-pay | Admitting: Emergency Medicine

## 2024-03-01 ENCOUNTER — Ambulatory Visit (HOSPITAL_COMMUNITY): Admission: EM | Admit: 2024-03-01 | Discharge: 2024-03-01 | Disposition: A | Discharge status: Home / Self Care

## 2024-03-01 DIAGNOSIS — R079 Chest pain, unspecified: Secondary | ICD-10-CM

## 2024-03-01 MED ORDER — ASPIRIN 81 MG PO CHEW
CHEWABLE_TABLET | ORAL | Status: AC
  Filled 2024-03-01: qty 4

## 2024-03-01 MED ORDER — ASPIRIN 81 MG PO CHEW
324.0000 mg | CHEWABLE_TABLET | Freq: Once | ORAL | Status: AC
  Administered 2024-03-01: 324 mg via ORAL

## 2024-03-01 NOTE — Discharge Instructions (Signed)
 1. Chest pain, unspecified type (Primary) - ED EKG completed in UC shows normal sinus rhythm with a ventricular rate of 66 bpm, no STEMI - aspirin chewable tablet 324 mg given in UC for acute chest pain -Based on current symptoms and risk factors follow-up in emergency department immediately for further evaluation and management is recommended. -Please go to ER after leaving urgent care for immediate evaluation with stat laboratory testing and possibly advanced imaging to determine cause of symptoms.

## 2024-03-01 NOTE — ED Provider Notes (Signed)
 [facility] [place]  Note:  This document was prepared using Dragon voice recognition software and may include unintentional dictation errors.  MRN: [id number] DOB: Nov 16, 1976  Subjective:   Lacey Gibson is a 48 y.o. female presenting for right anterior chest pain with radiation down the right arm and up to the right neck since Sunday.  Patient reports she woke up Sunday night with right neck pain radiating down to the right arm and anterior chest pain.  Patient reports that she has taken Tums with mild relief.  Patient states that symptoms never resolved.  Denies shortness of breath, weakness, dizziness, nausea vomiting, blurred vision, headache.  Patient has history of prediabetes, hyperlipidemia, hypertension, obesity and GERD.  No current facility-administered medications for this encounter.  Current Outpatient Medications:    atorvastatin (LIPITOR) 20 MG tablet, Take 1 tablet (20 mg total) by mouth daily. 1 at bedtime., Disp: 90 tablet, Rfl: 1   cholecalciferol (VITAMIN D3) 25 MCG (1000 UNIT) tablet, Take 1,000 Units by mouth daily., Disp: , Rfl:    magnesium (MAGTAB) 84 MG ( ) TBCR SR tablet, Take 84 mg by mouth., Disp: , Rfl:    metFORMIN (GLUCOPHAGE) 500 MG tablet, Take 1 tablet (500 mg total) by mouth daily with breakfast., Disp: 90 tablet, Rfl: 1   Norgestimate-Ethinyl Estradiol Triphasic 0.18/0.215/0.25 MG-25 MCG tab, Take 1 tablet by mouth daily., Disp: 28 tablet, Rfl: 11   Specialty Vitamins Products (HAIR NOURISHING SUPPLEMENT) TABS, Take by mouth., Disp: , Rfl:    Vitamin D, Ergocalciferol, (DRISDOL) 1.25 MG (50000 UNIT) CAPS capsule, TAKE 1 CAPSULE BY MOUTH EVERY 7 DAYS, Disp: 12 capsule, Rfl: 1   No Known Allergies  Past Medical History:  Diagnosis Date   Anemia    Anxiety    Edema, lower extremity    GERD (gastroesophageal reflux disease)    Hyperlipidemia 11/23/2017   Morbid obesity (HCC) 12/18/2018   Prediabetes 11/23/2017   Sleep apnea    SOB  (shortness of breath)      History reviewed. No pertinent surgical history.  Family History  Problem Relation Age of Onset   Diabetes Mother    Hypertension Mother    Sleep apnea Mother    Obesity Mother    Alzheimer's disease Father 56   Heart disease Father    Healthy Daughter    Asthma Daughter    Diabetes Maternal Grandmother    Breast cancer Neg Hx     Social History   Tobacco Use   Smoking status: Never   Smokeless tobacco: Never  Vaping Use   Vaping status: Never Used  Substance Use Topics   Alcohol use: Yes    Comment: 0-1   Drug use: No    ROS Refer to HPI for ROS details.  Objective:   Vitals: BP (!) 140/84 (BP Location: Right Arm)   Pulse 77   Temp 98 F (36.7 C) (Oral)   Resp (!) 22   LMP 12/14/2023 (Approximate)   SpO2 100%   Physical Exam Vitals and nursing note reviewed.  Constitutional:      General: She is not in acute distress.    Appearance: Normal appearance. She is well-developed. She is not ill-appearing, toxic-appearing or diaphoretic.  HENT:     Head: Normocephalic.  Cardiovascular:     Rate and Rhythm: Normal rate and regular rhythm.     Heart sounds: No murmur heard. Pulmonary:     Effort: Pulmonary effort is normal. No respiratory distress.     Breath sounds:  Normal breath sounds. No stridor. No wheezing, rhonchi or rales.  Chest:     Chest wall: Tenderness (Right anterior chest wall tenderness with palpation) present.  Musculoskeletal:     Cervical back: Neck supple. Tenderness (Right lateral neck tenderness on palpation) present. No rigidity.  Lymphadenopathy:     Cervical: No cervical adenopathy.  Skin:    General: Skin is warm and dry.  Neurological:     General: No focal deficit present.     Mental Status: She is alert and oriented to person, place, and time.  Psychiatric:        Mood and Affect: Mood normal.     Procedures  No results found for this or any previous visit (from the past 24  hours).  Assessment and Plan :   PDMP not reviewed this encounter.  1. Chest pain, unspecified type    1. Chest pain, unspecified type (Primary) - ED EKG completed in UC shows normal sinus rhythm with a ventricular rate of 66 bpm, no STEMI - aspirin chewable tablet 324 mg given in UC for acute chest pain -Based on current symptoms and risk factors follow-up in emergency department immediately for further evaluation and management is recommended. -Please go to ER after leaving urgent care for immediate evaluation with stat laboratory testing and possibly advanced imaging to determine cause of symptoms. -Continue to monitor symptoms for any change in severity if there is any escalation of current symptoms or development of new symptoms follow-up in ER for further evaluation and management.  Lucky Cowboy   [place], [place], [place] 03/01/24 [phone]

## 2024-03-01 NOTE — ED Notes (Signed)
 Patient is being discharged from the Urgent Care and sent to the Emergency Department via pov . Per Genelle Bal, np, patient is in need of higher level of care due to c/o chest pain. Patient is aware and verbalizes understanding of plan of care.  Vitals:   03/01/24 0820  BP: (!) 140/84  Pulse: 77  Resp: (!) 22  Temp: 98 F (36.7 C)  SpO2: 100%

## 2024-03-01 NOTE — ED Triage Notes (Signed)
 Pain in right chest and right arm started Sunday and had difficulty lifting right arm.  Seems to think Tums and gas-x helped.  Symptoms reoccurred last night.  Patient did sleep last night.  Patient has right neck pain.    Denies cough, cold, or runny nose.   Has not had any other medications for this No sob Has been clammy and thought to be menopause.  Has had chills.    Chest and neck does not hurt while sitting.  When attempting to move right arm, chest and neck hurt

## 2024-03-05 ENCOUNTER — Encounter: Payer: Self-pay | Admitting: Nurse Practitioner

## 2024-03-05 ENCOUNTER — Ambulatory Visit: Admitting: Nurse Practitioner

## 2024-03-05 VITALS — BP 124/82 | HR 76 | Wt 327.8 lb

## 2024-03-05 DIAGNOSIS — K253 Acute gastric ulcer without hemorrhage or perforation: Secondary | ICD-10-CM | POA: Insufficient documentation

## 2024-03-05 DIAGNOSIS — R198 Other specified symptoms and signs involving the digestive system and abdomen: Secondary | ICD-10-CM | POA: Diagnosis not present

## 2024-03-05 DIAGNOSIS — R1011 Right upper quadrant pain: Secondary | ICD-10-CM | POA: Diagnosis not present

## 2024-03-05 HISTORY — DX: Right upper quadrant pain: R10.11

## 2024-03-05 MED ORDER — SUCRALFATE 1 G PO TABS: 1.0000 g | ORAL_TABLET | Freq: Three times a day (TID) | ORAL | 1 refills | Status: DC

## 2024-03-05 MED ORDER — PANTOPRAZOLE SODIUM 40 MG PO TBEC: DELAYED_RELEASE_TABLET | ORAL | 0 refills | Status: DC

## 2024-03-05 NOTE — Progress Notes (Signed)
 Tollie Eth, DNP, AGNP-c [facility] [address] [place], [place] [address] [phone]   ACUTE VISIT- ESTABLISHED PATIENT  Blood pressure 124/82, pulse 76, weight (!) 327 lb 12.8 oz (148.7 kg), last menstrual period 12/14/2023.  Subjective:  HPI Lacey Gibson is a 48 y.o. female presents to day for evaluation of acute concern(s).   Seen in UC on 04/03 for CP. EKG showed no evidence of STEMI. She was instructed to go to the ED immediately for evaluation.   Today Aki reports onset of symptoms about a week ago where her pain was initially located in the upper right chest and right arm and back, but has since improved.   She tells me about two weeks before the symptoms started she experienced a viral illness with gastrointestinal symptoms, including frequent bathroom visits for loose stools, but no vomiting. Her husband had similar symptoms with some vomiting. Since then, she has not felt well and reports that eating sometimes makes her feel 'stuffed' and uncomfortable, particularly after consuming certain foods like shrimp or spicy dishes. She describes her stools as light yellow and loose, with no dark or tarry appearance. No burning sensation during bowel movements, but she mentions itching, likely due to frequent bathroom visits. She has been burping and belching frequently, seeking relief from these symptoms.  She is currently experiencing a sore throat, fullness in her abdomen, bloating, yellow liquid stool, and nausea. Eating greasy foods makes her symptoms worse.  ROS negative except for what is listed in HPI. History, Medications, Surgery, SDOH, and Family History reviewed and updated as appropriate.  Objective:  Physical Exam Vitals and nursing note reviewed.  Constitutional:      General: She is not in acute distress.    Appearance: Normal appearance. She is not ill-appearing or toxic-appearing.  HENT:     Head: Normocephalic.  Eyes:      Conjunctiva/sclera: Conjunctivae normal.  Neck:     Vascular: No carotid bruit.  Cardiovascular:     Rate and Rhythm: Normal rate and regular rhythm.     Pulses: Normal pulses.     Heart sounds: Normal heart sounds.  Pulmonary:     Effort: Pulmonary effort is normal.     Breath sounds: Normal breath sounds.  Abdominal:     General: Bowel sounds are normal. There is no distension.     Palpations: Abdomen is soft. There is no mass.     Tenderness: There is no abdominal tenderness. There is no guarding or rebound.  Musculoskeletal:     Cervical back: Neck supple. No tenderness.     Right lower leg: No edema.     Left lower leg: No edema.  Lymphadenopathy:     Cervical: No cervical adenopathy.  Skin:    General: Skin is warm and dry.     Capillary Refill: Capillary refill takes less than 2 seconds.  Neurological:     General: No focal deficit present.     Mental Status: She is alert and oriented to person, place, and time.  Psychiatric:        Mood and Affect: Mood normal.        Behavior: Behavior normal.         Assessment & Plan:   Problem List Items Addressed This Visit     Acute gastric ulcer - Primary   Presents with symptoms indicative of GERD and possible gastric ulceration, including burping, belching, chest tightness, and throat soreness, persisting for a week post-viral illness. Symptoms  exacerbate postprandially, especially with spicy and greasy foods. Suspects viral illness may have triggered gastric ulceration. Differential diagnosis includes gallbladder or pancreatic origin, but symptomatology favors GERD. Discussed pantoprazole for acid reduction and Carafate for gastric lining protection. Ultrasound considered if symptoms worsen or localize.   - Prescribe pantoprazole 40 mg twice daily for 2 weeks, then reduce to once daily.   - Advise avoiding fried, greasy, and spicy foods.   - Recommend no fried foods. OK using an air fryer instead of traditional frying  methods.   - Prescribe Carafate to be taken with meals to coat the stomach lining.   - Suggest over-the-counter Pepcid (famotidine) 20 mg twice daily if symptoms persist.   - Order liver function and pancreas tests.   - Consider ultrasound if symptoms worsen or localize.         Relevant Medications   pantoprazole (PROTONIX) 40 MG tablet   sucralfate (CARAFATE) 1 g tablet   Right upper quadrant abdominal pain   Relevant Orders   Hepatic function panel   Amylase      Tollie Eth, DNP, AGNP-c

## 2024-03-05 NOTE — Assessment & Plan Note (Signed)
 Presents with symptoms indicative of GERD and possible gastric ulceration, including burping, belching, chest tightness, and throat soreness, persisting for a week post-viral illness. Symptoms exacerbate postprandially, especially with spicy and greasy foods. Suspects viral illness may have triggered gastric ulceration. Differential diagnosis includes gallbladder or pancreatic origin, but symptomatology favors GERD. Discussed pantoprazole for acid reduction and Carafate for gastric lining protection. Ultrasound considered if symptoms worsen or localize.   - Prescribe pantoprazole 40 mg twice daily for 2 weeks, then reduce to once daily.   - Advise avoiding fried, greasy, and spicy foods.   - Recommend no fried foods. OK using an air fryer instead of traditional frying methods.   - Prescribe Carafate to be taken with meals to coat the stomach lining.   - Suggest over-the-counter Pepcid (famotidine) 20 mg twice daily if symptoms persist.   - Order liver function and pancreas tests.   - Consider ultrasound if symptoms worsen or localize.

## 2024-03-05 NOTE — Patient Instructions (Addendum)
 You can take Pepcid (famotidine) 20mg  twice a day while you wait for the pantoprazole to work  Here are some food tips:  AVOID  High-Fat and [employer] (french fries, fried chicken, onion rings)  Fatty cuts of meat (bacon, sausage, ribs)  Full-fat dairy (whole milk, cream, cheese, ice cream)  Fast food and greasy takeout  Lard, butter, and shortening  Processed and Refined Foods  White bread, pasta, and rice  Pastries, cakes, cookies  Sugary snacks and sodas  Chips and snack crackers  High-Cholesterol Foods Organ meats (liver, kidney)  Egg yolks (in excess)  Shellfish (in some cases)  Certain Oils and Fats  Palm oil and coconut oil (high in saturated fat)  Hydrogenated oils / Trans fats (found in processed baked goods)  Other Triggers (Varies by Individual)  Caffeinated beverages  Spicy foods  Alcohol

## 2024-03-06 LAB — HEPATIC FUNCTION PANEL
ALT: 24 IU/L (ref 0–32)
AST: 18 IU/L (ref 0–40)
Albumin: 3.9 g/dL (ref 3.9–4.9)
Alkaline Phosphatase: 81 IU/L (ref 44–121)
Bilirubin Total: 0.3 mg/dL (ref 0.0–1.2)
Bilirubin, Direct: 0.11 mg/dL (ref 0.00–0.40)
Total Protein: 7 g/dL (ref 6.0–8.5)

## 2024-03-06 LAB — AMYLASE: Amylase: 68 U/L (ref 31–110)

## 2024-03-07 ENCOUNTER — Encounter: Payer: Self-pay | Admitting: Nurse Practitioner

## 2024-03-30 ENCOUNTER — Other Ambulatory Visit: Payer: Self-pay | Admitting: Nurse Practitioner

## 2024-03-30 DIAGNOSIS — R198 Other specified symptoms and signs involving the digestive system and abdomen: Secondary | ICD-10-CM

## 2024-03-31 ENCOUNTER — Other Ambulatory Visit: Payer: Self-pay | Admitting: Nurse Practitioner

## 2024-03-31 DIAGNOSIS — R198 Other specified symptoms and signs involving the digestive system and abdomen: Secondary | ICD-10-CM

## 2024-09-03 ENCOUNTER — Encounter: Payer: Self-pay | Admitting: Nurse Practitioner

## 2024-09-03 ENCOUNTER — Ambulatory Visit: Payer: No Typology Code available for payment source | Admitting: Nurse Practitioner

## 2024-09-03 VITALS — BP 130/82 | HR 86 | Ht 68.0 in | Wt 307.0 lb

## 2024-09-03 DIAGNOSIS — Z0000 Encounter for general adult medical examination without abnormal findings: Secondary | ICD-10-CM | POA: Diagnosis not present

## 2024-09-03 DIAGNOSIS — E559 Vitamin D deficiency, unspecified: Secondary | ICD-10-CM

## 2024-09-03 DIAGNOSIS — R5382 Chronic fatigue, unspecified: Secondary | ICD-10-CM | POA: Diagnosis not present

## 2024-09-03 DIAGNOSIS — L659 Nonscarring hair loss, unspecified: Secondary | ICD-10-CM

## 2024-09-03 DIAGNOSIS — R0683 Snoring: Secondary | ICD-10-CM

## 2024-09-03 DIAGNOSIS — R7303 Prediabetes: Secondary | ICD-10-CM

## 2024-09-03 DIAGNOSIS — Z23 Encounter for immunization: Secondary | ICD-10-CM | POA: Diagnosis not present

## 2024-09-03 DIAGNOSIS — Z1211 Encounter for screening for malignant neoplasm of colon: Secondary | ICD-10-CM

## 2024-09-03 DIAGNOSIS — N951 Menopausal and female climacteric states: Secondary | ICD-10-CM

## 2024-09-03 DIAGNOSIS — E66813 Obesity, class 3: Secondary | ICD-10-CM

## 2024-09-03 DIAGNOSIS — Z6841 Body Mass Index (BMI) 40.0 and over, adult: Secondary | ICD-10-CM

## 2024-09-03 DIAGNOSIS — D509 Iron deficiency anemia, unspecified: Secondary | ICD-10-CM

## 2024-09-03 DIAGNOSIS — E782 Mixed hyperlipidemia: Secondary | ICD-10-CM | POA: Diagnosis not present

## 2024-09-03 DIAGNOSIS — G4700 Insomnia, unspecified: Secondary | ICD-10-CM

## 2024-09-03 LAB — LIPID PANEL

## 2024-09-03 MED ORDER — TRAZODONE HCL 50 MG PO TABS: 25.0000 mg | ORAL_TABLET | Freq: Every evening | ORAL | 6 refills | Status: AC | PRN

## 2024-09-03 NOTE — Progress Notes (Signed)
 [name], DNP, AGNP-c [facility] [address] [place], [place] [address] Main Office [phone] VISIT TYPE: CPE on 09/03/2024 Today's Vitals   09/03/24 0816  BP: 130/82  Pulse: 86  Weight: (!) 307 lb (139.3 kg)  Height: 5' 8 (1.727 m)   Body mass index is 46.68 kg/m. BP 130/82   Pulse 86   Ht 5' 8 (1.727 m)   Wt (!) 307 lb (139.3 kg)   LMP  (LMP Unknown)   BMI 46.68 kg/m   Subjective:    Patient ID: Lacey Gibson, female    DOB: 02/17/1976, 48 y.o.   MRN: [id number]  HPI:  History of Present Illness Lacey Gibson is a 48 year old female who presents for CPE. Concerns of ongoing fatigue presented.   She experiences fatigue three to four days a week, accompanied by difficulty falling asleep or staying asleep about five days a week. Her sleep is disrupted by waking up at 3-4 AM, watching TV, and then returning to sleep. She notes changes in her sleep habits, sometimes struggling to fall asleep due to overthinking. Occasionally, she experiences dizziness and headaches, which she describes as similar to 'period headaches'.  She has a history of iron deficiency anemia and vitamin D  deficiency. Recently, she resumed taking iron supplements after feeling particularly depleted, suspecting low iron levels. She also notes feeling better after taking vitamin D  supplements. She is not currently taking her statin or metformin .  She reports irregular periods, with her last period being light and occurring last month. She experiences hot flashes, particularly in the mornings when getting dressed for church. 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  Her diet typically includes coffee in the morning without breakfast until around 11 AM, which she attributes to her work schedule. She sometimes feels a crash in energy,  possibly due to low blood sugar. She is trying to incorporate more protein into her diet and has been preplanning meals to achieve better nutritional balance. She walks in the park for 30 minutes, three to four times a week, which she finds helps with stress.  She reports hair thinning and loss, noticing more hair coming out during washing and combing, and seeing her scalp more in certain areas. She has not experienced any patchy hair loss but feels her hair is thinner overall.  No shortness of breath, palpitations, changes in bowel or bladder habits, or swelling in feet or ankles. Reports snoring and occasional breath-holding during sleep, as noted by her daughter. Pertinent items are noted in HPI.  Most Recent Depression Screen:     09/03/2024    8:15 AM 08/30/2023    1:36 PM 03/10/2022    8:21 AM 08/06/2020    8:54 AM 07/16/2020    1:28 PM  Depression screen PHQ 2/9  Decreased Interest 0 1 0 2 0  Down, Depressed, Hopeless 0 1 0 2 0  PHQ - 2 Score 0 2 0 4 0  Altered sleeping 3 0  3   Tired, decreased energy 2 2  2    Change in appetite 0 2  2   Feeling bad or failure about yourself  0 3  1   Trouble concentrating 0 2  3   Moving slowly or fidgety/restless 0 0  0   Suicidal thoughts  0  1   PHQ-9 Score 5 11  16  Difficult doing work/chores Somewhat difficult Somewhat difficult  Somewhat difficult    Most Recent Anxiety Screen:     10/15/2019    2:28 PM  GAD 7 : Generalized Anxiety Score  Nervous, Anxious, on Edge 0  Control/stop worrying 0  Worry too much - different things 1  Trouble relaxing 1  Restless 0  Easily annoyed or irritable 1  Afraid - awful might happen 0  Total GAD 7 Score 3  Anxiety Difficulty Not difficult at all   Most Recent Fall Screen:    09/03/2024    8:15 AM 08/30/2023    1:36 PM 03/10/2022    8:20 AM 06/11/2021   11:23 AM 07/16/2020    1:28 PM  Fall Risk   Falls in the past year? 0 0 0 0 0  Number falls in past yr: 0 0 0 0 0  Injury with Fall? 0 0 0  0 0  Risk for fall due to : No Fall Risks No Fall Risks No Fall Risks No Fall Risks   Follow up Falls evaluation completed Falls evaluation completed Falls evaluation completed  Falls evaluation completed       Data saved with a previous flowsheet row definition    Past medical history, surgical history, medications, allergies, family history and social history reviewed with patient today and changes made to appropriate areas of the chart.  Past Medical History:  Past Medical History:  Diagnosis Date   Anemia    Anxiety    Dyspnea on exertion 09/05/2023   Edema, lower extremity    GERD (gastroesophageal reflux disease)    Hyperlipidemia 11/23/2017   Morbid obesity (HCC) 12/18/2018   Prediabetes 11/23/2017   Right upper quadrant abdominal pain 03/05/2024   Sleep apnea    SOB (shortness of breath)    Medications:  Current Outpatient Medications on File Prior to Visit  Medication Sig   pantoprazole  (PROTONIX ) 40 MG tablet Take 1 tablet (40 mg total) by mouth daily. TAKE 1 TABLET BY MOUTH TWICE DAILY FOR 2 WEEKS THEN ONCE A DAY.   Specialty Vitamins Products (HAIR NOURISHING SUPPLEMENT) TABS Take by mouth.   Vitamin D , Ergocalciferol , (DRISDOL ) 1.25 MG (50000 UNIT) CAPS capsule TAKE 1 CAPSULE BY MOUTH EVERY 7 DAYS   No current facility-administered medications on file prior to visit.   Surgical History:  No past surgical history on file. Allergies:  No Known Allergies Family History:  Family History  Problem Relation Age of Onset   Diabetes Mother    Hypertension Mother    Sleep apnea Mother    Obesity Mother    Alzheimer's disease Father 48   Heart disease Father    Healthy Daughter    Asthma Daughter    Diabetes Maternal Grandmother    Breast cancer Neg Hx        Objective:    BP 130/82   Pulse 86   Ht 5' 8 (1.727 m)   Wt (!) 307 lb (139.3 kg)   LMP  (LMP Unknown)   BMI 46.68 kg/m   Wt Readings from Last 3 Encounters:  09/03/24 (!) 307 lb (139.3 kg)   03/05/24 (!) 327 lb 12.8 oz (148.7 kg)  08/30/23 (!) 335 lb (152 kg)    Physical Exam Vitals and nursing note reviewed.  Constitutional:      General: She is not in acute distress.    Appearance: Normal appearance. She is obese. She is not ill-appearing.  HENT:     Head: Normocephalic.  Right Ear: Tympanic membrane normal.     Left Ear: Tympanic membrane normal.     Nose: Nose normal.     Mouth/Throat:     Mouth: Mucous membranes are moist.     Pharynx: Oropharynx is clear.  Eyes:     Conjunctiva/sclera: Conjunctivae normal.     Pupils: Pupils are equal, round, and reactive to light.  Neck:     Vascular: No carotid bruit.  Cardiovascular:     Rate and Rhythm: Normal rate and regular rhythm.     Pulses: Normal pulses.     Heart sounds: Normal heart sounds.  Pulmonary:     Effort: Pulmonary effort is normal.     Breath sounds: Normal breath sounds.  Abdominal:     General: Bowel sounds are normal. There is no distension.     Palpations: Abdomen is soft.     Tenderness: There is no abdominal tenderness. There is no right CVA tenderness, left CVA tenderness or guarding.  Musculoskeletal:        General: Normal range of motion.     Cervical back: No tenderness.     Right lower leg: No edema.     Left lower leg: No edema.  Lymphadenopathy:     Cervical: No cervical adenopathy.  Skin:    General: Skin is warm and dry.     Capillary Refill: Capillary refill takes less than 2 seconds.  Neurological:     Mental Status: She is alert and oriented to person, place, and time.     Sensory: No sensory deficit.     Motor: No weakness.     Coordination: Coordination normal.  Psychiatric:        Mood and Affect: Mood normal.        Behavior: Behavior normal.     Results for orders placed or performed in visit on 09/03/24  CBC with Differential/Platelet   Collection Time: 09/03/24 11:50 AM  Result Value Ref Range   WBC 7.0 3.4 - 10.8 x10E3/uL   RBC 5.72 (H) 3.77 - 5.28  x10E6/uL   Hemoglobin 14.5 11.1 - 15.9 g/dL   Hematocrit 52.7 (H) 65.9 - 46.6 %   MCV 83 79 - 97 fL   MCH 25.3 (L) 26.6 - 33.0 pg   MCHC 30.7 (L) 31.5 - 35.7 g/dL   RDW 84.2 (H) 88.2 - 84.5 %   Platelets 299 150 - 450 x10E3/uL   Neutrophils 52 Not Estab. %   Lymphs 35 Not Estab. %   Monocytes 8 Not Estab. %   Eos 4 Not Estab. %   Basos 1 Not Estab. %   Neutrophils Absolute 3.7 1.4 - 7.0 x10E3/uL   Lymphocytes Absolute 2.4 0.7 - 3.1 x10E3/uL   Monocytes Absolute 0.5 0.1 - 0.9 x10E3/uL   EOS (ABSOLUTE) 0.3 0.0 - 0.4 x10E3/uL   Basophils Absolute 0.1 0.0 - 0.2 x10E3/uL   Immature Granulocytes 0 Not Estab. %   Immature Grans (Abs) 0.0 0.0 - 0.1 x10E3/uL  CMP14+EGFR   Collection Time: 09/03/24 11:50 AM  Result Value Ref Range   Glucose 82 70 - 99 mg/dL   BUN 12 6 - 24 mg/dL   Creatinine, Ser 8.96 (H) 0.57 - 1.00 mg/dL   eGFR 67 >40 fO/fpw/8.26   BUN/Creatinine Ratio 12 9 - 23   Sodium 140 134 - 144 mmol/L   Potassium 4.7 3.5 - 5.2 mmol/L   Chloride 104 96 - 106 mmol/L   CO2 23 20 - 29 mmol/L  Calcium  9.5 8.7 - 10.2 mg/dL   Total Protein 7.2 6.0 - 8.5 g/dL   Albumin 4.1 3.9 - 4.9 g/dL   Globulin, Total 3.1 1.5 - 4.5 g/dL   Bilirubin Total 0.4 0.0 - 1.2 mg/dL   Alkaline Phosphatase 91 41 - 116 IU/L   AST 17 0 - 40 IU/L   ALT 19 0 - 32 IU/L  Hemoglobin A1c   Collection Time: 09/03/24 11:50 AM  Result Value Ref Range   Hgb A1c MFr Bld 5.5 4.8 - 5.6 %   Est. average glucose Bld gHb Est-mCnc 111 mg/dL  Lipid panel   Collection Time: 09/03/24 11:50 AM  Result Value Ref Range   Cholesterol, Total 213 (H) 100 - 199 mg/dL   Triglycerides 76 0 - 149 mg/dL   HDL 64 >60 mg/dL   VLDL Cholesterol Cal 13 5 - 40 mg/dL   LDL Chol Calc (NIH) 863 (H) 0 - 99 mg/dL   Chol/HDL Ratio 3.3 0.0 - 4.4 ratio  Iron, TIBC and Ferritin Panel   Collection Time: 09/03/24 11:50 AM  Result Value Ref Range   Total Iron Binding Capacity 358 250 - 450 ug/dL   UIBC 695 868 - 574 ug/dL   Iron 54 27 -  840 ug/dL   Iron Saturation 15 15 - 55 %   Ferritin 48 15 - 150 ng/mL  VITAMIN D  25 Hydroxy (Vit-D Deficiency, Fractures)   Collection Time: 09/03/24 11:50 AM  Result Value Ref Range   Vit D, 25-Hydroxy 34.9 30.0 - 100.0 ng/mL  Vitamin B12   Collection Time: 09/03/24 11:51 AM  Result Value Ref Range   Vitamin B-12 508 232 - 1,245 pg/mL       Assessment & Plan:   Problem List Items Addressed This Visit     Prediabetes   Not currently taking metformin . Discussed dietary habits and potential impact on blood sugar levels.      Relevant Orders   CBC with Differential/Platelet (Completed)   Vitamin D  deficiency   History of iron deficiency anemia and vitamin D  deficiency. Reports feeling better when taking vitamin D  supplements. Recent fatigue may be related to low iron or vitamin D  levels. - Check iron and vitamin D  levels      Relevant Orders   VITAMIN D  25 Hydroxy (Vit-D Deficiency, Fractures) (Completed)   Mixed hyperlipidemia   No current treatment as she is not taking atorvastatin . Discussed adherence issues with previous medication regimen. Recommend considering restarting therapy, at least a few days a week to help reduce cardiovascular disease risks in the setting of high lipids, obesity, and pre-diabetes.        Relevant Orders   CBC with Differential/Platelet (Completed)   CMP14+EGFR (Completed)   Lipid panel (Completed)   Class 3 severe obesity with body mass index (BMI) of 50.0 to 59.9 in adult [facility])   Has lost 30 pounds over the past year. Discussed dietary habits and the importance of protein intake. Encouraged preplanning meals to ensure balanced nutrition. - Provide high protein meal and snack ideas      Relevant Orders   CBC with Differential/Platelet (Completed)   CMP14+EGFR (Completed)   Hemoglobin A1c (Completed)   Lipid panel (Completed)   Perimenopause   Experiencing perimenopausal symptoms including irregular periods, hot flashes, and fatigue.  Symptoms are contributing to sleep disturbances and overall fatigue.      Iron deficiency anemia   History of iron deficiency anemia and vitamin D  deficiency. Reports feeling better when taking  vitamin D  supplements. Recent fatigue may be related to low iron or vitamin D  levels. - Check iron and vitamin D  levels      Relevant Orders   Iron, TIBC and Ferritin Panel (Completed)   Encounter for annual physical exam - Primary   CPE completed today. Review of HM activities and recommendations discussed and provided on AVS. Anticipatory guidance, diet, and exercise recommendations provided. Medications, allergies, and hx reviewed and updated as necessary. Orders placed as listed below.  Plan: - Labs ordered. Will make changes as necessary based on results.  - I will review these results and send recommendations via MyChart or a telephone call.  - F/U with CPE in 1 year or sooner for acute/chronic health needs as directed.        Chronic fatigue   Ongoing fatigue and sleep disturbances, including difficulty falling asleep and waking up Doneen Ollinger. Possible contributing factors include perimenopausal symptoms, stress, and potential sleep apnea. Reports snoring and occasional breath holding during sleep, which may indicate sleep apnea. Not ready for sleep apnea test at this time. - Prescribe trazodone as needed for sleep - Discuss potential sleep apnea and consider home sleep study if symptoms worsen      Relevant Orders   CBC with Differential/Platelet (Completed)   CMP14+EGFR (Completed)   Hemoglobin A1c (Completed)   Lipid panel (Completed)   Iron, TIBC and Ferritin Panel (Completed)   Vitamin B12 (Completed)   Insomnia   Etiology unclear, possible link to perimenopause. Healthy sleep habits recommended. Trial of trazodone for sleep.       Relevant Medications   traZODone (DESYREL) 50 MG tablet   Other Relevant Orders   Vitamin B12 (Completed)   Snoring   Recommend home sleep study,  particularly if symptoms worsen due to snoring and daytime fatigue. Screening declined at this time, but patient encouraged to reach out if she changes her mind and we can add this at a later time.       Relevant Orders   CBC with Differential/Platelet (Completed)   CMP14+EGFR (Completed)   Hemoglobin A1c (Completed)   Lipid panel (Completed)   Hair loss   Reports hair thinning, particularly noticing more hair loss during washing and combing. Possible causes include vitamin deficiencies, anemia, or perimenopausal changes. - Check vitamin D , B12, and iron levels - Consider referral to dermatologist specializing in hair loss if labs are normal      Relevant Orders   Vitamin B12 (Completed)   Other Visit Diagnoses       Need for influenza vaccination       Relevant Orders   Flu vaccine trivalent PF, 6mos and older(Flulaval,Afluria,Fluarix,Fluzone) (Completed)     Need for COVID-19 vaccine       Relevant Orders   Pfizer Comirnaty Covid-19 Vaccine 23yrs & older (Completed)     Screening for colon cancer       Relevant Orders   Ambulatory referral to Gastroenterology       Follow up plan: Return in about 1 year (around 09/03/2025) for CPE.  NEXT PREVENTATIVE PHYSICAL DUE IN 1 YEAR.  PATIENT COUNSELING PROVIDED FOR ALL ADULT PATIENTS: A well balanced diet low in saturated fats, cholesterol, and moderation in carbohydrates.  This can be as simple as monitoring portion sizes and cutting back on sugary beverages such as soda and juice to start with.    Daily water consumption of at least 64 ounces.  Physical activity at least 180 minutes per week.  If just starting out,  start 10 minutes a day and work your way up.   This can be as simple as taking the stairs instead of the elevator and walking 2-3 laps around the office  purposefully every day.   STD protection, partner selection, and regular testing if high risk.  Limited consumption of alcoholic beverages if alcohol is  consumed. For men, I recommend no more than 14 alcoholic beverages per week, spread out throughout the week (max 2 per day). Avoid binge drinking or consuming large quantities of alcohol in one setting.  Please let me know if you feel you may need help with reduction or quitting alcohol consumption.   Avoidance of nicotine, if used. Please let me know if you feel you may need help with reduction or quitting nicotine use.   Daily mental health attention. This can be in the form of 5 minute daily meditation, prayer, journaling, yoga, reflection, etc.  Purposeful attention to your emotions and mental state can significantly improve your overall wellbeing  and  Health.  Please know that I am here to help you with all of your health care goals and am happy to work with you to find a solution that works best for you.  The greatest advice I have received with any changes in life are to take it one step at a time, that even means if all you can focus on is the next 60 seconds, then do that and celebrate your victories.  With any changes in life, you will have set backs, and that is OK. The important thing to remember is, if you have a set back, it is not a failure, it is an opportunity to try again! Screening Testing Mammogram Every 1 -2 years based on history and risk factors Starting at age 16 Pap Smear Ages 21-39 every 3 years Ages 14-65 every 5 years with HPV testing More frequent testing may be required based on results and history Colon Cancer Screening Every 1-10 years based on test performed, risk factors, and history Starting at age 46 Bone Density Screening Every 2-10 years based on history Starting at age 45 for women Recommendations for men differ based on medication usage, history, and risk factors AAA Screening One time ultrasound Men 22-59 years old who have every smoked Lung Cancer Screening Low Dose Lung CT every 12 months Age 47-80 years with a 30 pack-year smoking  history who still smoke or who have quit within the last 15 years   Screening Labs Routine  Labs: Complete Blood Count (CBC), Complete Metabolic Panel (CMP), Cholesterol (Lipid Panel) Every 6-12 months based on history and medications May be recommended more frequently based on current conditions or previous results Hemoglobin A1c Lab Every 3-12 months based on history and previous results Starting at age 34 or earlier with diagnosis of diabetes, high cholesterol, BMI >26, and/or risk factors Frequent monitoring for patients with diabetes to ensure blood sugar control Thyroid Panel (TSH) Every 6 months based on history, symptoms, and risk factors May be repeated more often if on medication HIV One time testing for all patients 68 and older May be repeated more frequently for patients with increased risk factors or exposure Hepatitis C One time testing for all patients 61 and older May be repeated more frequently for patients with increased risk factors or exposure Gonorrhea, Chlamydia Every 12 months for all sexually active persons 13-24 years Additional monitoring may be recommended for those who are considered high risk or who have symptoms Every 12 months for  any woman on birth control, regardless of sexual activity PSA Men 24-19 years old with risk factors Additional screening may be recommended from age 8-69 based on risk factors, symptoms, and history  Vaccine Recommendations Tetanus Booster All adults every 10 years Flu Vaccine All patients 6 months and older every year COVID Vaccine All patients 12 years and older Initial dosing with booster May recommend additional booster based on age and health history HPV Vaccine 2 doses all patients age 68-26 Dosing may be considered for patients over 26 Shingles Vaccine (Shingrix) 2 doses all adults 55 years and older Pneumonia (Pneumovax 1) All adults 65 years and older May recommend earlier dosing based on health  history One year apart from Prevnar 69 Pneumonia (Prevnar 44) All adults 65 years and older Dosed 1 year after Pneumovax 23 Pneumonia (Prevnar 20) One time alternative to the two dosing of 13 and 23 For all adults with initial dose of 23, 20 is recommended 1 year later For all adults with initial dose of 13, 23 is still recommended as second option 1 year later

## 2024-09-03 NOTE — Patient Instructions (Addendum)
 You are due for your colon cancer screening. I have placed a referral to the GI office. They will call you to schedule this.   Keep up the great work with walking!! You have lost 30lbs in the last year, which is amazing!!   For all adult patients, I recommend A well balanced diet low in saturated fats, cholesterol, and moderation in carbohydrates.   This can be as simple as monitoring portion sizes and cutting back on sugary beverages such as soda and juice to start with.    Daily water consumption of at least 64 ounces.  Physical activity at least 180 minutes per week, if just starting out.   This can be as simple as taking the stairs instead of the elevator and walking 2-3 laps around the office  purposefully every day.   STD protection, partner selection, and regular testing if high risk.  Limited consumption of alcoholic beverages if alcohol is consumed.  For women, I recommend no more than 7 alcoholic beverages per week, spread out throughout the week.  Avoid binge drinking or consuming large quantities of alcohol in one setting.   Please let me know if you feel you may need help with reduction or quitting alcohol consumption.   Avoidance of nicotine, if used.  Please let me know if you feel you may need help with reduction or quitting nicotine use.   Daily mental health attention.  This can be in the form of 5 minute daily meditation, prayer, journaling, yoga, reflection, etc.   Purposeful attention to your emotions and mental state can significantly improve your overall wellbeing  and  Health.  Please know that I am here to help you with all of your health care goals and am happy to work with you to find a solution that works best for you.  The greatest advice I have received with any changes in life are to take it one step at a time, that even means if all you can focus on is the next 60 seconds, then do that and celebrate your victories.  With any changes in life, you will  have set backs, and that is OK. The important thing to remember is, if you have a set back, it is not a failure, it is an opportunity to try again!  Health Maintenance Recommendations Screening Testing Mammogram Every 1 -2 years based on history and risk factors Starting at age 33 Pap Smear Ages 21-39 every 3 years Ages 78-65 every 5 years with HPV testing More frequent testing may be required based on results and history Colon Cancer Screening Every 1-10 years based on test performed, risk factors, and history Starting at age 47 Bone Density Screening Every 2-10 years based on history Starting at age 77 for women Recommendations for men differ based on medication usage, history, and risk factors AAA Screening One time ultrasound Men 58-12 years old who have every smoked Lung Cancer Screening Low Dose Lung CT every 12 months Age 30-80 years with a 30 pack-year smoking history who still smoke or who have quit within the last 15 years  Screening Labs Routine  Labs: Complete Blood Count (CBC), Complete Metabolic Panel (CMP), Cholesterol (Lipid Panel) Every 6-12 months based on history and medications May be recommended more frequently based on current conditions or previous results Hemoglobin A1c Lab Every 3-12 months based on history and previous results Starting at age 15 or earlier with diagnosis of diabetes, high cholesterol, BMI >26, and/or risk factors Frequent monitoring for  patients with diabetes to ensure blood sugar control Thyroid Panel (TSH w/ T3 & T4) Every 6 months based on history, symptoms, and risk factors May be repeated more often if on medication HIV One time testing for all patients 73 and older May be repeated more frequently for patients with increased risk factors or exposure Hepatitis C One time testing for all patients 35 and older May be repeated more frequently for patients with increased risk factors or exposure Gonorrhea, Chlamydia Every 12 months  for all sexually active persons 13-24 years Additional monitoring may be recommended for those who are considered high risk or who have symptoms PSA Men 33-40 years old with risk factors Additional screening may be recommended from age 26-69 based on risk factors, symptoms, and history  Vaccine Recommendations Tetanus Booster All adults every 10 years Flu Vaccine All patients 6 months and older every year COVID Vaccine All patients 12 years and older Initial dosing with booster May recommend additional booster based on age and health history HPV Vaccine 2 doses all patients age 83-26 Dosing may be considered for patients over 26 Shingles Vaccine (Shingrix) 2 doses all adults 55 years and older Pneumonia (Pneumovax 23) All adults 65 years and older May recommend earlier dosing based on health history Pneumonia (Prevnar 55) All adults 65 years and older Dosed 1 year after Pneumovax 23  Additional Screening, Testing, and Vaccinations may be recommended on an individualized basis based on family history, health history, risk factors, and/or exposure.

## 2024-09-04 LAB — CBC WITH DIFFERENTIAL/PLATELET
Basophils Absolute: 0.1 x10E3/uL (ref 0.0–0.2)
Basos: 1 %
EOS (ABSOLUTE): 0.3 x10E3/uL (ref 0.0–0.4)
Eos: 4 %
Hematocrit: 47.2 % — ABNORMAL HIGH (ref 34.0–46.6)
Hemoglobin: 14.5 g/dL (ref 11.1–15.9)
Immature Grans (Abs): 0 x10E3/uL (ref 0.0–0.1)
Immature Granulocytes: 0 %
Lymphocytes Absolute: 2.4 x10E3/uL (ref 0.7–3.1)
Lymphs: 35 %
MCH: 25.3 pg — ABNORMAL LOW (ref 26.6–33.0)
MCHC: 30.7 g/dL — ABNORMAL LOW (ref 31.5–35.7)
MCV: 83 fL (ref 79–97)
Monocytes Absolute: 0.5 x10E3/uL (ref 0.1–0.9)
Monocytes: 8 %
Neutrophils Absolute: 3.7 x10E3/uL (ref 1.4–7.0)
Neutrophils: 52 %
Platelets: 299 x10E3/uL (ref 150–450)
RBC: 5.72 x10E6/uL — ABNORMAL HIGH (ref 3.77–5.28)
RDW: 15.7 % — ABNORMAL HIGH (ref 11.7–15.4)
WBC: 7 x10E3/uL (ref 3.4–10.8)

## 2024-09-04 LAB — CMP14+EGFR
ALT: 19 IU/L (ref 0–32)
AST: 17 IU/L (ref 0–40)
Albumin: 4.1 g/dL (ref 3.9–4.9)
Alkaline Phosphatase: 91 IU/L (ref 41–116)
BUN/Creatinine Ratio: 12 (ref 9–23)
BUN: 12 mg/dL (ref 6–24)
Bilirubin Total: 0.4 mg/dL (ref 0.0–1.2)
CO2: 23 mmol/L (ref 20–29)
Calcium: 9.5 mg/dL (ref 8.7–10.2)
Chloride: 104 mmol/L (ref 96–106)
Creatinine, Ser: 1.03 mg/dL — AB (ref 0.57–1.00)
Globulin, Total: 3.1 g/dL (ref 1.5–4.5)
Glucose: 82 mg/dL (ref 70–99)
Potassium: 4.7 mmol/L (ref 3.5–5.2)
Sodium: 140 mmol/L (ref 134–144)
Total Protein: 7.2 g/dL (ref 6.0–8.5)
eGFR: 67 mL/min/1.73 (ref >59)

## 2024-09-04 LAB — IRON,TIBC AND FERRITIN PANEL
Ferritin: 48 ng/mL (ref 15–150)
Iron Saturation: 15 % (ref 15–55)
Iron: 54 ug/dL (ref 27–159)
Total Iron Binding Capacity: 358 ug/dL (ref 250–450)
UIBC: 304 ug/dL (ref 131–425)

## 2024-09-04 LAB — LIPID PANEL
Cholesterol, Total: 213 mg/dL — AB (ref 100–199)
HDL: 64 mg/dL (ref >39)
LDL CALC COMMENT:: 3.3 ratio (ref 0.0–4.4)
LDL Chol Calc (NIH): 136 mg/dL — AB (ref 0–99)
Triglycerides: 76 mg/dL (ref 0–149)
VLDL Cholesterol Cal: 13 mg/dL (ref 5–40)

## 2024-09-04 LAB — VITAMIN D 25 HYDROXY (VIT D DEFICIENCY, FRACTURES): Vit D, 25-Hydroxy: 34.9 ng/mL (ref 30.0–100.0)

## 2024-09-04 LAB — HEMOGLOBIN A1C
Est. average glucose Bld gHb Est-mCnc: 111 mg/dL
Hgb A1c MFr Bld: 5.5 % (ref 4.8–5.6)

## 2024-09-04 LAB — VITAMIN B12: Vitamin B-12: 508 pg/mL (ref 232–1245)

## 2024-09-05 ENCOUNTER — Ambulatory Visit: Payer: Self-pay | Admitting: Nurse Practitioner

## 2024-09-05 DIAGNOSIS — D751 Secondary polycythemia: Secondary | ICD-10-CM

## 2024-09-05 DIAGNOSIS — R0683 Snoring: Secondary | ICD-10-CM

## 2024-09-05 DIAGNOSIS — L659 Nonscarring hair loss, unspecified: Secondary | ICD-10-CM

## 2024-09-05 DIAGNOSIS — R400 Somnolence: Secondary | ICD-10-CM

## 2024-09-12 DIAGNOSIS — L659 Nonscarring hair loss, unspecified: Secondary | ICD-10-CM | POA: Insufficient documentation

## 2024-09-12 NOTE — Assessment & Plan Note (Signed)
 Ongoing fatigue and sleep disturbances, including difficulty falling asleep and waking up Lacey Gibson. Possible contributing factors include perimenopausal symptoms, stress, and potential sleep apnea. Reports snoring and occasional breath holding during sleep, which may indicate sleep apnea. Not ready for sleep apnea test at this time. - Prescribe trazodone as needed for sleep - Discuss potential sleep apnea and consider home sleep study if symptoms worsen

## 2024-09-12 NOTE — Assessment & Plan Note (Signed)
 Reports hair thinning, particularly noticing more hair loss during washing and combing. Possible causes include vitamin deficiencies, anemia, or perimenopausal changes. - Check vitamin D , B12, and iron levels - Consider referral to dermatologist specializing in hair loss if labs are normal

## 2024-09-12 NOTE — Assessment & Plan Note (Signed)
 Not currently taking metformin . Discussed dietary habits and potential impact on blood sugar levels.

## 2024-09-12 NOTE — Assessment & Plan Note (Signed)

## 2024-09-12 NOTE — Assessment & Plan Note (Signed)
 No current treatment as she is not taking atorvastatin . Discussed adherence issues with previous medication regimen. Recommend considering restarting therapy, at least a few days a week to help reduce cardiovascular disease risks in the setting of high lipids, obesity, and pre-diabetes.

## 2024-09-12 NOTE — Assessment & Plan Note (Signed)
 Etiology unclear, possible link to perimenopause. Healthy sleep habits recommended. Trial of trazodone for sleep.

## 2024-09-12 NOTE — Assessment & Plan Note (Signed)
 History of iron deficiency anemia and vitamin D  deficiency. Reports feeling better when taking vitamin D  supplements. Recent fatigue may be related to low iron or vitamin D  levels. - Check iron and vitamin D  levels

## 2024-09-12 NOTE — Assessment & Plan Note (Addendum)
 Recommend home sleep study, particularly if symptoms worsen due to snoring and daytime fatigue. Screening declined at this time, but patient encouraged to reach out if she changes her mind and we can add this at a later time.

## 2024-09-12 NOTE — Assessment & Plan Note (Signed)
 Experiencing perimenopausal symptoms including irregular periods, hot flashes, and fatigue. Symptoms are contributing to sleep disturbances and overall fatigue.

## 2024-09-12 NOTE — Assessment & Plan Note (Signed)
 Has lost 30 pounds over the past year. Discussed dietary habits and the importance of protein intake. Encouraged preplanning meals to ensure balanced nutrition. - Provide high protein meal and snack ideas

## 2024-09-18 ENCOUNTER — Encounter: Payer: Self-pay | Admitting: Nurse Practitioner

## 2024-09-23 ENCOUNTER — Other Ambulatory Visit: Payer: Self-pay | Admitting: Nurse Practitioner

## 2024-09-26 ENCOUNTER — Other Ambulatory Visit: Payer: Self-pay | Admitting: Nurse Practitioner

## 2024-09-26 DIAGNOSIS — Z1231 Encounter for screening mammogram for malignant neoplasm of breast: Secondary | ICD-10-CM

## 2024-10-04 ENCOUNTER — Institutional Professional Consult (permissible substitution): Admitting: Neurology

## 2024-10-09 ENCOUNTER — Ambulatory Visit
Admission: RE | Admit: 2024-10-09 | Discharge: 2024-10-09 | Disposition: A | Source: Ambulatory Visit | Attending: Nurse Practitioner

## 2024-10-09 DIAGNOSIS — Z1231 Encounter for screening mammogram for malignant neoplasm of breast: Secondary | ICD-10-CM

## 2024-10-12 ENCOUNTER — Ambulatory Visit: Payer: Self-pay | Admitting: Nurse Practitioner

## 2025-09-16 ENCOUNTER — Encounter: Payer: Self-pay | Admitting: Nurse Practitioner
